# Patient Record
Sex: Female | Born: 1955 | Race: White | Hispanic: No | Marital: Married | Smoking: Former smoker
Health system: Southern US, Community
[De-identification: ages and names within clinical notes are randomized; demographics above are authoritative.]

## PROBLEM LIST (undated history)

## (undated) DIAGNOSIS — Z836 Family history of other diseases of the respiratory system: Secondary | ICD-10-CM

## (undated) DIAGNOSIS — E039 Hypothyroidism, unspecified: Secondary | ICD-10-CM

## (undated) DIAGNOSIS — J45909 Unspecified asthma, uncomplicated: Secondary | ICD-10-CM

## (undated) DIAGNOSIS — M2241 Chondromalacia patellae, right knee: Secondary | ICD-10-CM

## (undated) HISTORY — PX: BREAST ENHANCEMENT SURGERY: SHX7

## (undated) HISTORY — PX: WRIST SURGERY: SHX841

## (undated) HISTORY — DX: Hypothyroidism, unspecified: E03.9

## (undated) HISTORY — PX: AUGMENTATION MAMMAPLASTY: SUR837

## (undated) HISTORY — DX: Family history of other diseases of the respiratory system: Z83.6

## (undated) HISTORY — PX: KNEE ARTHROSCOPY: SHX127

## (undated) HISTORY — DX: Unspecified asthma, uncomplicated: J45.909

---

## 1998-05-31 ENCOUNTER — Other Ambulatory Visit: Admission: RE | Admit: 1998-05-31 | Discharge: 1998-05-31 | Payer: Self-pay | Admitting: Obstetrics and Gynecology

## 1999-04-03 ENCOUNTER — Other Ambulatory Visit: Admission: RE | Admit: 1999-04-03 | Discharge: 1999-04-03 | Payer: Self-pay | Admitting: Obstetrics and Gynecology

## 1999-05-09 ENCOUNTER — Ambulatory Visit (HOSPITAL_COMMUNITY): Admission: RE | Admit: 1999-05-09 | Discharge: 1999-05-09 | Payer: Self-pay | Admitting: Neurosurgery

## 1999-05-09 ENCOUNTER — Encounter: Payer: Self-pay | Admitting: Neurosurgery

## 1999-06-14 ENCOUNTER — Ambulatory Visit (HOSPITAL_COMMUNITY): Admission: RE | Admit: 1999-06-14 | Discharge: 1999-06-14 | Payer: Self-pay | Admitting: Neurosurgery

## 1999-06-15 ENCOUNTER — Encounter: Payer: Self-pay | Admitting: Neurosurgery

## 1999-09-08 ENCOUNTER — Encounter: Payer: Self-pay | Admitting: Orthopedic Surgery

## 1999-09-08 ENCOUNTER — Ambulatory Visit (HOSPITAL_COMMUNITY): Admission: RE | Admit: 1999-09-08 | Discharge: 1999-09-08 | Payer: Self-pay | Admitting: Orthopedic Surgery

## 2000-08-26 ENCOUNTER — Other Ambulatory Visit: Admission: RE | Admit: 2000-08-26 | Discharge: 2000-08-26 | Payer: Self-pay | Admitting: Obstetrics and Gynecology

## 2001-02-24 ENCOUNTER — Encounter: Admission: RE | Admit: 2001-02-24 | Discharge: 2001-05-05 | Payer: Self-pay | Admitting: *Deleted

## 2001-04-30 ENCOUNTER — Encounter: Admission: RE | Admit: 2001-04-30 | Discharge: 2001-04-30 | Payer: Self-pay | Admitting: Obstetrics and Gynecology

## 2001-04-30 ENCOUNTER — Encounter: Payer: Self-pay | Admitting: Obstetrics and Gynecology

## 2001-12-04 ENCOUNTER — Other Ambulatory Visit: Admission: RE | Admit: 2001-12-04 | Discharge: 2001-12-04 | Payer: Self-pay | Admitting: Obstetrics and Gynecology

## 2002-11-18 ENCOUNTER — Encounter: Payer: Self-pay | Admitting: Obstetrics and Gynecology

## 2002-11-18 ENCOUNTER — Encounter: Admission: RE | Admit: 2002-11-18 | Discharge: 2002-11-18 | Payer: Self-pay | Admitting: Obstetrics and Gynecology

## 2002-12-17 ENCOUNTER — Other Ambulatory Visit: Admission: RE | Admit: 2002-12-17 | Discharge: 2002-12-17 | Payer: Self-pay | Admitting: Obstetrics and Gynecology

## 2003-04-21 ENCOUNTER — Encounter: Admission: RE | Admit: 2003-04-21 | Discharge: 2003-04-21 | Payer: Self-pay | Admitting: Obstetrics and Gynecology

## 2003-04-21 ENCOUNTER — Encounter: Payer: Self-pay | Admitting: Obstetrics and Gynecology

## 2003-05-31 ENCOUNTER — Encounter: Payer: Self-pay | Admitting: Internal Medicine

## 2003-05-31 ENCOUNTER — Encounter: Admission: RE | Admit: 2003-05-31 | Discharge: 2003-05-31 | Payer: Self-pay | Admitting: Internal Medicine

## 2003-09-17 ENCOUNTER — Encounter: Admission: RE | Admit: 2003-09-17 | Discharge: 2003-09-17 | Payer: Self-pay | Admitting: Obstetrics and Gynecology

## 2003-10-18 ENCOUNTER — Encounter (HOSPITAL_COMMUNITY): Admission: RE | Admit: 2003-10-18 | Discharge: 2004-01-16 | Payer: Self-pay | Admitting: Obstetrics and Gynecology

## 2004-01-13 ENCOUNTER — Emergency Department (HOSPITAL_COMMUNITY): Admission: EM | Admit: 2004-01-13 | Discharge: 2004-01-13 | Payer: Self-pay | Admitting: Emergency Medicine

## 2004-01-19 ENCOUNTER — Other Ambulatory Visit: Admission: RE | Admit: 2004-01-19 | Discharge: 2004-01-19 | Payer: Self-pay | Admitting: Obstetrics and Gynecology

## 2004-11-03 ENCOUNTER — Ambulatory Visit: Payer: Self-pay | Admitting: Internal Medicine

## 2005-03-08 ENCOUNTER — Other Ambulatory Visit: Admission: RE | Admit: 2005-03-08 | Discharge: 2005-03-08 | Payer: Self-pay | Admitting: Obstetrics and Gynecology

## 2005-05-04 ENCOUNTER — Encounter: Admission: RE | Admit: 2005-05-04 | Discharge: 2005-05-04 | Payer: Self-pay | Admitting: Obstetrics and Gynecology

## 2005-05-10 ENCOUNTER — Encounter: Admission: RE | Admit: 2005-05-10 | Discharge: 2005-05-10 | Payer: Self-pay | Admitting: Obstetrics and Gynecology

## 2006-05-27 ENCOUNTER — Encounter: Admission: RE | Admit: 2006-05-27 | Discharge: 2006-05-27 | Payer: Self-pay | Admitting: Obstetrics and Gynecology

## 2006-10-16 ENCOUNTER — Ambulatory Visit: Payer: Self-pay

## 2007-09-09 ENCOUNTER — Encounter: Admission: RE | Admit: 2007-09-09 | Discharge: 2007-09-09 | Payer: Self-pay | Admitting: Obstetrics and Gynecology

## 2007-12-09 ENCOUNTER — Encounter: Admission: RE | Admit: 2007-12-09 | Discharge: 2007-12-09 | Payer: Self-pay | Admitting: Obstetrics and Gynecology

## 2008-09-13 ENCOUNTER — Encounter: Admission: RE | Admit: 2008-09-13 | Discharge: 2008-09-13 | Payer: Self-pay | Admitting: Neurological Surgery

## 2008-09-13 IMAGING — CR DG RIBS W/ CHEST 3+V*R*
3 series · 3 of 3 positions shown · non-contrast
Comparison: None

CLINICAL DATA: Right rib pain after fall last week.

RIGHT RIBS AND CHEST - 3+ VIEW

[w chest pa]
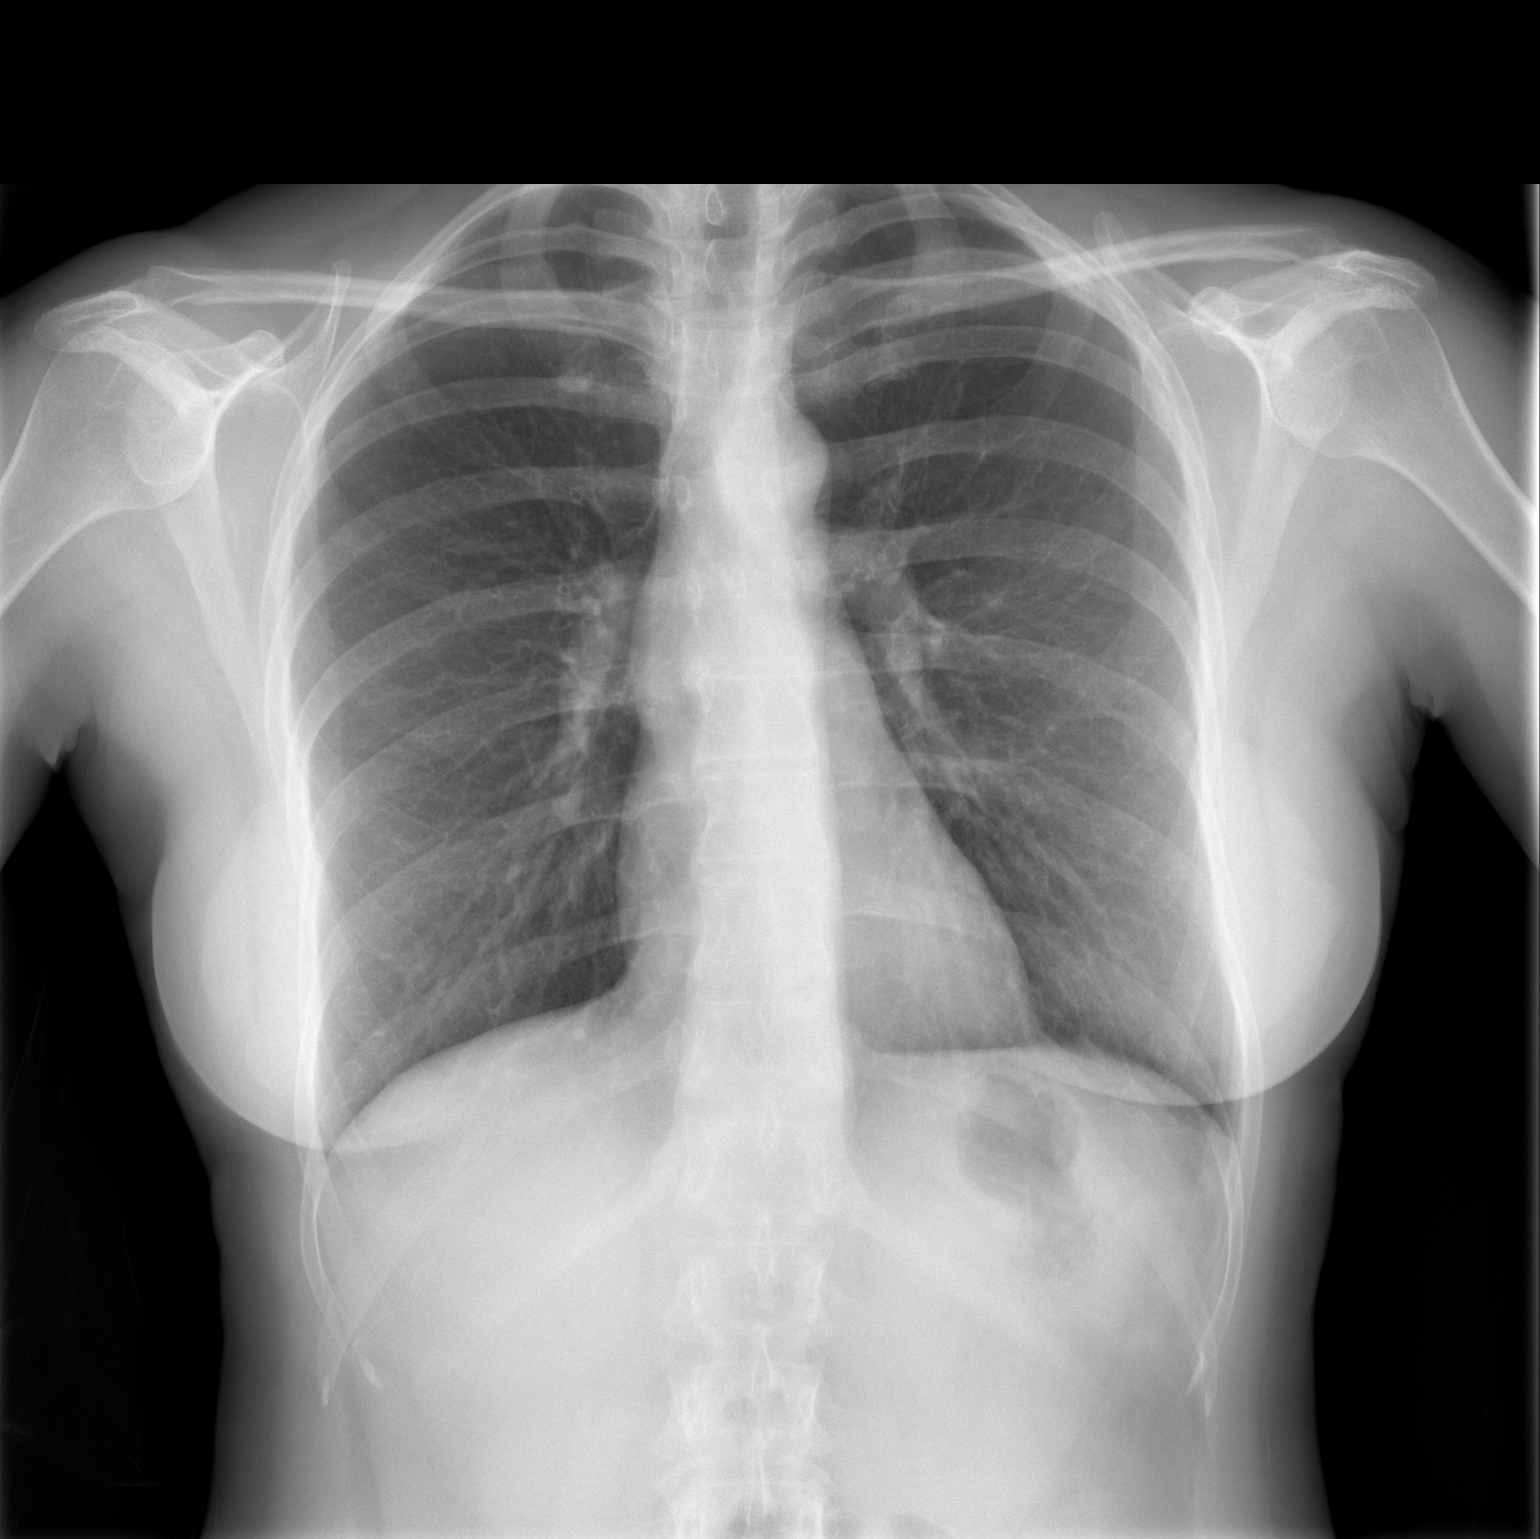

[w ribs ap/pa upper right]
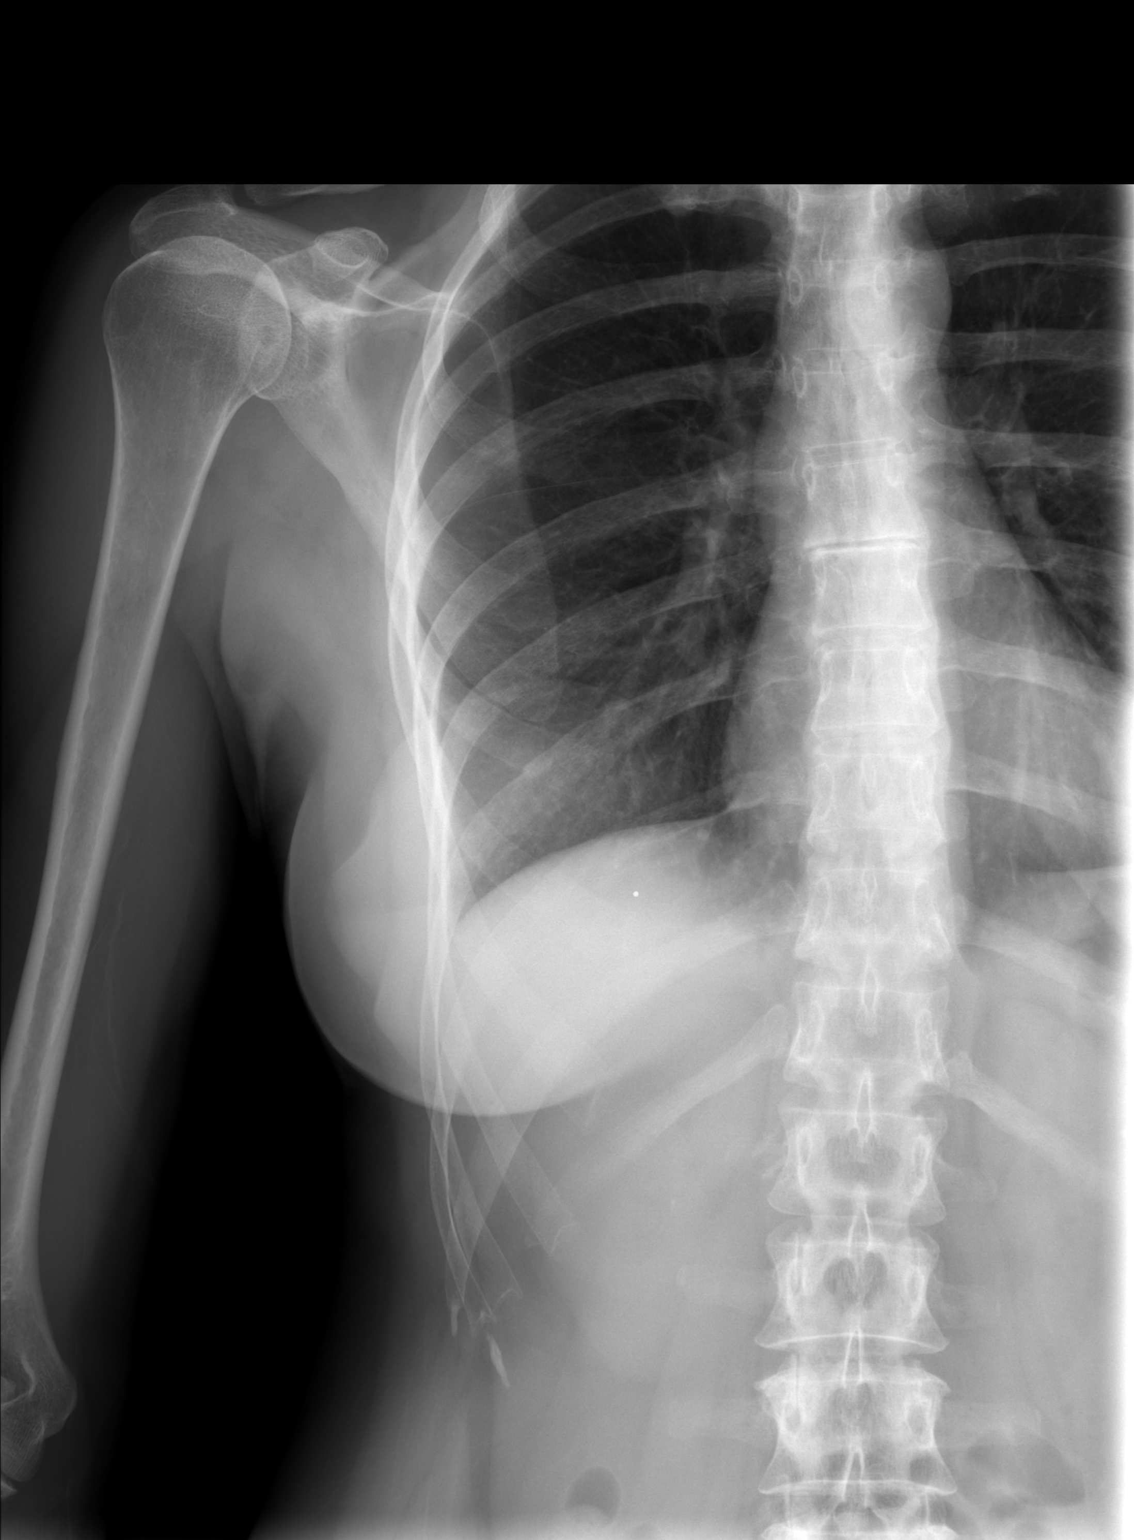

[w ribs oblique right]
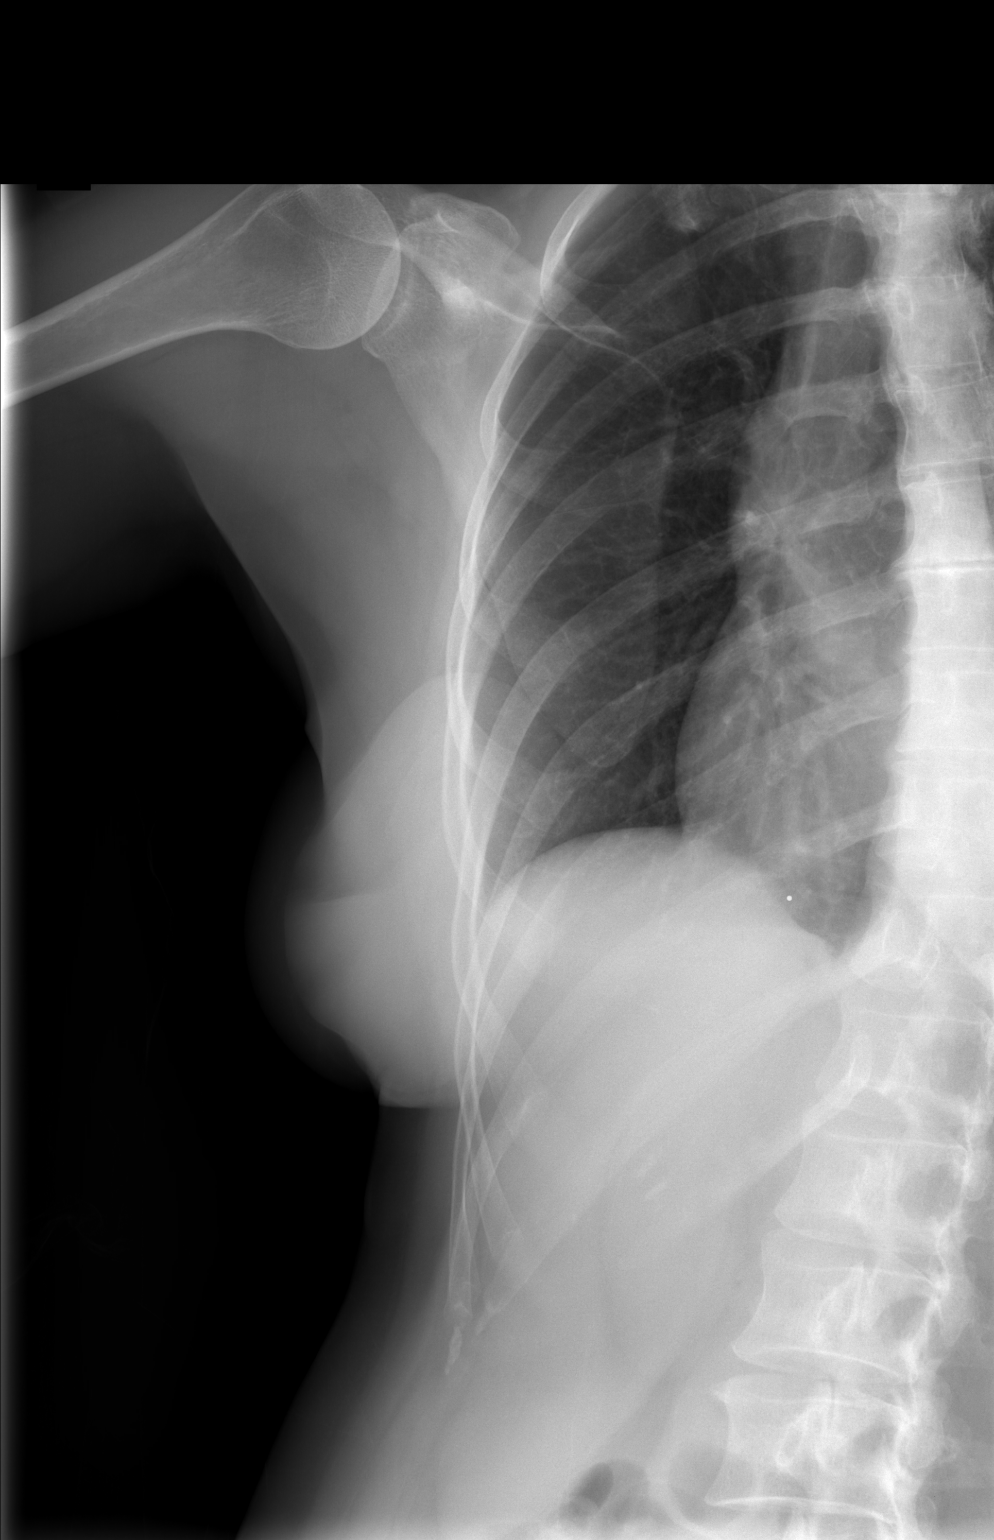

[3 of 3 positions shown; findings below may reference images not displayed]

FINDINGS: Frontal view of the chest shows midline trachea and
normal heart size.  Lungs are clear.  There may be nondisplaced
fractures of the right ninth and tenth posterior ribs.
IMPRESSION: Question nondisplaced fractures of the right ninth and tenth
posterior ribs.

## 2009-01-18 ENCOUNTER — Ambulatory Visit: Payer: Self-pay | Admitting: Internal Medicine

## 2009-01-18 DIAGNOSIS — E039 Hypothyroidism, unspecified: Secondary | ICD-10-CM | POA: Insufficient documentation

## 2009-01-18 DIAGNOSIS — R05 Cough: Secondary | ICD-10-CM

## 2009-01-18 DIAGNOSIS — R059 Cough, unspecified: Secondary | ICD-10-CM | POA: Insufficient documentation

## 2009-10-20 ENCOUNTER — Encounter: Admission: RE | Admit: 2009-10-20 | Discharge: 2009-10-20 | Payer: Self-pay | Admitting: Obstetrics and Gynecology

## 2010-10-19 ENCOUNTER — Ambulatory Visit (HOSPITAL_BASED_OUTPATIENT_CLINIC_OR_DEPARTMENT_OTHER): Admission: RE | Admit: 2010-10-19 | Payer: Self-pay | Admitting: Orthopedic Surgery

## 2010-11-05 ENCOUNTER — Encounter: Payer: Self-pay | Admitting: Neurosurgery

## 2010-11-05 ENCOUNTER — Encounter: Payer: Self-pay | Admitting: Obstetrics and Gynecology

## 2010-11-07 ENCOUNTER — Other Ambulatory Visit: Payer: Self-pay | Admitting: Obstetrics and Gynecology

## 2010-11-07 DIAGNOSIS — Z1239 Encounter for other screening for malignant neoplasm of breast: Secondary | ICD-10-CM

## 2010-11-15 ENCOUNTER — Ambulatory Visit: Payer: Self-pay

## 2010-11-16 NOTE — Assessment & Plan Note (Signed)
Summary: Pulmnary/ acute eval cough   Primary Provider/Referring Provider:  None  CC:  Recurrent Bronchitis.  History of Present Illness: 7 yowf quit smoking 1980  with evidence of mild chronic asthma in 01/27/09.  January 18, 2009 ov fine with no need for albuterol until uri 2 weeks prior to ov  rx with zpak after exposed to sick child and since then worse harsh dry cough/ congestion/fatigue/ scratchy throat, no purulent sputum or def fever.  Pt denies any significant  nasal congestion or excess secretions, fever, chills, sweats, unintended wt loss, pleuritic or exertional cp, orthopnea pnd or leg swelling.  Pt also denies any obvious fluctuation in symptoms with weather or environmental change or other alleviating or aggravating factors.       Current Medications (verified): 1)  Synthroid 25 Mcg Tabs (Levothyroxine Sodium) .Marland Kitchen.. 1 Once Daily  Allergies (verified): 1)  ! Sulfa  Past History:  Past Medical History:    Hypothyroidism........................................................Marland KitchenSouth    Chronic mild asthma        - PFT's 01/28/04  18% improvment after B2 despite baseline FEV1 107%  Family History:    COPD Father, smoker  Social History:    Former smoker.  Quit 1980.  Smoked for 5 years at 1/4 ppd.  Review of Systems  The patient denies shortness of breath with activity, shortness of breath at rest, productive cough, coughing up blood, chest pain, irregular heartbeats, acid heartburn, indigestion, loss of appetite, weight change, abdominal pain, difficulty swallowing, tooth/dental problems, headaches, nasal congestion/difficulty breathing through nose, sneezing, itching, ear ache, anxiety, depression, hand/feet swelling, joint stiffness or pain, rash, and change in color of mucus.    Vital Signs:  Patient profile:   55 year old female Height:      65 inches Weight:      136 pounds BMI:     22.71 Temp:     99.3 degrees F oral Pulse rate:   81 / minute BP sitting:    108 / 64  (left arm)  Vitals Entered By: Vernie Murders (January 18, 2009 10:01 AM)  O2 Sat on room air at rest %:  98  Physical Exam  Additional Exam:  Pleasant healthy appearing amb wf nad Wt 136 January 18, 2009  HEENT: nl dentition, turbinates, and orophanx. Nl external ear canals without cough reflex NECK :  without JVD/Nodes/TM/ nl carotid upstrokes bilaterally LUNGS: no acc muscle use, trace wheeze with FVC CV:  RRR  no s3 or murmur or increase in P2, no edema   ABD:  soft and nontender with nl excursion in the supine position. No bruits or organomegaly, bowel sounds nl MS:  warm without deformities, calf tenderness, cyanosis or clubbing SKIN: warm and dry without lesions   NEURO:  alert, approp, no deficits     Impression & Recommendations:  Problem # 1:  COUGH (ICD-786.2)  I spent extra time with the patient today explaining optimal mdi  technique.  This improved from  50-75%  Likely this is mild flare of AB secondary to URI with no evidence of bacterial infection so rx with Symbicort 80 short term only, the way it is used in [place] explained to patient in detail.  I discussed the importance of understanding the goals of asthma therapy including the rule of 2's as it applies to the use of a rescue inhaler.   Each maintenance medication was reviewed in detail including most importantly the difference between maintenance and as needed and under what  circumstances the prns are to be used. See instructions for specific recommendations   Orders: New Patient Level IV (78469)  Medications Added to Medication List This Visit: 1)  Synthroid 25 Mcg Tabs (Levothyroxine sodium) .Marland Kitchen.. 1 once daily 2)  Prednisone 10 Mg Tabs (Prednisone) .... 4 each am x 2days, 2x2days, 1x2days and stop  Patient Instructions: 1)  Symbicort 80  2 puffs every 12 hours if need for cough wheeze or short of breath 2)  Prednisone 4 each am x 2days, 2x2days, 1x2days and stop if not improving on symbicort 3)   Call if sputum turns yellow or fever call for abx or as needed Prescriptions: PREDNISONE 10 MG  TABS (PREDNISONE) 4 each am x 2days, 2x2days, 1x2days and stop  #14 x 0   Entered and Authorized by:   Nyoka Cowden MD   Signed by:   Nyoka Cowden MD on 01/18/2009   Method used:   Print then Give to Patient   RxID:   [id number]

## 2010-11-28 ENCOUNTER — Ambulatory Visit
Admission: RE | Admit: 2010-11-28 | Discharge: 2010-11-28 | Disposition: A | Discharge status: Home / Self Care | Payer: BC Managed Care – PPO | Source: Ambulatory Visit | Attending: Obstetrics and Gynecology | Admitting: Obstetrics and Gynecology

## 2010-11-28 DIAGNOSIS — Z1239 Encounter for other screening for malignant neoplasm of breast: Secondary | ICD-10-CM

## 2010-12-29 ENCOUNTER — Encounter (HOSPITAL_BASED_OUTPATIENT_CLINIC_OR_DEPARTMENT_OTHER)
Admission: RE | Admit: 2010-12-29 | Discharge: 2010-12-29 | Disposition: A | Discharge status: Home / Self Care | Payer: BC Managed Care – PPO | Source: Ambulatory Visit | Attending: Orthopedic Surgery | Admitting: Orthopedic Surgery

## 2011-01-04 ENCOUNTER — Ambulatory Visit (HOSPITAL_BASED_OUTPATIENT_CLINIC_OR_DEPARTMENT_OTHER)
Admission: RE | Admit: 2011-01-04 | Discharge: 2011-01-04 | Disposition: A | Discharge status: Home / Self Care | Payer: BC Managed Care – PPO | Source: Ambulatory Visit | Attending: Orthopedic Surgery | Admitting: Orthopedic Surgery

## 2011-01-04 DIAGNOSIS — M6740 Ganglion, unspecified site: Secondary | ICD-10-CM | POA: Insufficient documentation

## 2011-01-04 DIAGNOSIS — M2240 Chondromalacia patellae, unspecified knee: Secondary | ICD-10-CM | POA: Insufficient documentation

## 2011-01-04 DIAGNOSIS — Z01812 Encounter for preprocedural laboratory examination: Secondary | ICD-10-CM | POA: Insufficient documentation

## 2011-01-04 DIAGNOSIS — J45909 Unspecified asthma, uncomplicated: Secondary | ICD-10-CM | POA: Insufficient documentation

## 2011-01-04 DIAGNOSIS — M23305 Other meniscus derangements, unspecified medial meniscus, unspecified knee: Secondary | ICD-10-CM | POA: Insufficient documentation

## 2011-01-04 LAB — POCT HEMOGLOBIN-HEMACUE: Hemoglobin: 13.9 g/dL (ref 12.0–15.0)

## 2011-01-18 NOTE — Op Note (Signed)
NAME:  Michelle Nolan, Michelle Nolan NO.:  [id number]  MEDICAL RECORD NO.:  [id number]           PATIENT TYPE:  LOCATION:                                 FACILITY:  PHYSICIAN:  Loreta Ave, M.D. DATE OF BIRTH:  Sep 28, 1956  DATE OF PROCEDURE:  01/04/2011 DATE OF DISCHARGE:                              OPERATIVE REPORT   PREOPERATIVE DIAGNOSES: 1. 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 2. Ganglion cyst volar retinacular area of over A2 pulley, left index     finger.  POSTOPERATIVE DIAGNOSES: 1. 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 2. Ganglion cyst volar retinacular area of over A2 pulley, left index     finger.  PROCEDURE:  Left knee exam under anesthesia, arthroscopy, partial medial meniscectomy, chondroplasty patella.  Left index finger excision of ganglion cyst from volar retinacular area off A2 pulley.  SURGEON:  Loreta Ave, MD  ASSISTANT:  Genene Churn. Barry Dienes, [place]  ANESTHESIA:  General.  BLOOD LOSS:  Minimal.  SPECIMENS:  None.  CULTURES:  None,  COMPLICATIONS:  None.  DRESSING:  Soft compressive on the knee and hand.  TOURNIQUET TIME:  Thirty minutes on the left arm, not used on the leg.  PROCEDURE:  The patient brought to the operating room, placed on the operating table in supine position.  After adequate anesthesia had been obtained, tourniquet applied, left arm.  Leg holder to left leg.  Both were prepped and draped in usual sterile fashion.  Attention turned to her knee.  Two portals, one each medial and lateral parapatellar. Arthroscope introduced.  Knee distended and inspected.  Some grade 3 changes chronic superomedial patella debrided to a stable surface. Midportion laterally looked good.  Trochlea looked good.  Tracking very acceptable.  Cruciate ligaments intact.  Lateral meniscus, lateral compartment normal.  Medial meniscus complex tearing of posterior third. Taken down to stable rim, tapered into  remaining meniscus.  No degenerative changes.  The entire knee examined, no other findings appreciated.  Instruments and fluid removed.  Portals closed with nylon. Knee injected with Depo-Medrol and Marcaine.  Dressing applied. Attention turned to her hand.  Exsanguinated with elevation Esmarch, tourniquet inflated to 250 mmHg.  The ganglion which could be palpated index finger over the A2 pulley was approached with a V-shaped incision. Skin and subcutaneous tissue divided.  Ganglion immediately evident coming out of the slightly ulnar aspect of the A2 pulley.  This was large 3-4 mm diameter.  Neurovascular structures protected on either side.  Ganglion excised in its entirety including a small window off the tendon sheath.  There was no triggering and no need to release down further.  Tendon below looked good.  All recess examined to be sure that the ganglion was completely excised.  Wound was irrigated and closed with interrupted nylon.  Sterile compressive dressing applied. Tourniquet deflated and removed.  Anesthesia reversed.  Brought to recovery room.  Tolerated surgery well.  No complications.     Loreta Ave, M.D.     DFM/MEDQ  D:  01/04/2011  T:  01/05/2011  Job:  [id number]  Electronically Signed by Mckinley Jewel  M.D. on 01/18/2011 02:09:02 PM

## 2011-03-02 NOTE — Consult Note (Signed)
NAME:  Michelle Nolan, Michelle Nolan                        ACCOUNT NO.:  [id number]   MEDICAL RECORD NO.:  [id number]                   PATIENT TYPE:  EMS   LOCATION:  ED                                   FACILITY:  [facility]   PHYSICIAN:  Currie Paris, M.D.           DATE OF BIRTH:  27-Sep-1956   DATE OF CONSULTATION:  01/13/2004  DATE OF DISCHARGE:                                   CONSULTATION   EMERGENCY ROOM VISIT:   CHIEF COMPLAINT:  Abdominal pain.   HISTORY OF PRESENT ILLNESS:  Michelle Nolan is a 55 year old lady, who  presents to the emergency room with about a 2 day history of high abdominal  pain which is primarily right lower quadrant.  It has waxed and waned a  little bit.  It has been a burning sensation.  It has also gone to her right  back.  Her husband, who is a physician, had examined her and thought she had  some right lower quadrant tenderness and was concerned about a smoldering  appendicitis.   The patient had a little bit of intermittent nausea and anorexia but no  vomiting and actually this evening was quite hungry.  She had no fever or  chills.  She had no urinary tract symptoms.  She has had no GYN symptoms  either.  She thinks she may be perimenopausal.   PAST HISTORY:  Unremarkable.   PHYSICAL EXAMINATION:  GENERAL:  The patient is alert, oriented, generally  healthy-appearing.  VITAL SIGNS:  Unremarkable.  LUNGS:  Clear.  HEART:  Regular.  ABDOMEN:  Soft, but she does have some mild right lower quadrant tenderness  to palpation.  There is no guarding, no rebound.  Bowel sounds are normal.   LABORATORY STUDIES:  CBC, CMET, and white count are normal.  CT scan is  reviewed with Dr. Anselmo Pickler, the radiologist, and looks completely  normal with the exception of a small, partially collapsed right ovarian cyst  and a little free fluid in the peritoneal cavity.  The appendix is  visualized and appears normal.   IMPRESSION:  A 3 day history of right lower  quadrant pain, possibly ruptured  ovarian cyst.   PLAN:  She will follow up should her symptoms get any worse.  In any event,  she will follow up with Dr. Dareen Piano regarding her right ovarian cyst.                                               Currie Paris, M.D.    CJS/MEDQ  D:  01/13/2004  T:  01/13/2004  Job:  [id number]   cc:   Malva Limes, M.D.  [address]  [place]  [place] [address]  Fax: [phone]

## 2011-03-02 NOTE — Assessment & Plan Note (Signed)
 HEALTHCARE                            CARDIOLOGY OFFICE NOTE   NAME:Steyer, ORIEL OJO                     MRN:          [id number]  DATE:10/13/2006                            DOB:          09/19/1956    I received a call from [place] and her husband on their way back from  [place].  For the past month, Jaquisha has been having episodes of left  arm discomfort.  Today in [place] she had an episode of substernal  chest discomfort.  The discomfort persisted.  She had a little bit of a  hard time catching her breath.  Because her sister-in-law had  nitroglycerin, her husband, who is a Midwife, gave her a sublingual  nitroglycerin, and the discomfort gradually and slowly eased off.  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  She is an active individual, and  based on this, we discussed a possibility of doing an exercise tolerance  test to rule out underlying coronary artery disease given her need for  continued levels of activity.  We will try to make arrangements for a  stress Cardiolite imaging study in the next couple of days.  If she has  any problems in the interim, she is to call me promptly.     Arturo Morton. Riley Kill, MD, [facility]  Electronically Signed    TDS/MedQ  DD: 10/18/2006  DT: 10/18/2006  Job #: [id number]

## 2011-08-08 ENCOUNTER — Encounter: Payer: Self-pay | Admitting: *Deleted

## 2011-08-09 ENCOUNTER — Ambulatory Visit (INDEPENDENT_AMBULATORY_CARE_PROVIDER_SITE_OTHER)
Admission: RE | Admit: 2011-08-09 | Discharge: 2011-08-09 | Disposition: A | Discharge status: Home / Self Care | Payer: BC Managed Care – PPO | Source: Ambulatory Visit | Attending: Internal Medicine | Admitting: Internal Medicine

## 2011-08-09 ENCOUNTER — Encounter: Payer: Self-pay | Admitting: Internal Medicine

## 2011-08-09 ENCOUNTER — Ambulatory Visit (INDEPENDENT_AMBULATORY_CARE_PROVIDER_SITE_OTHER): Payer: BC Managed Care – PPO | Admitting: Internal Medicine

## 2011-08-09 VITALS — BP 118/68 | HR 84 | Temp 98.3°F | Ht 65.0 in | Wt 135.0 lb

## 2011-08-09 DIAGNOSIS — R0989 Other specified symptoms and signs involving the circulatory and respiratory systems: Secondary | ICD-10-CM

## 2011-08-09 DIAGNOSIS — R0609 Other forms of dyspnea: Secondary | ICD-10-CM

## 2011-08-09 DIAGNOSIS — J45909 Unspecified asthma, uncomplicated: Secondary | ICD-10-CM | POA: Insufficient documentation

## 2011-08-09 DIAGNOSIS — R0600 Dyspnea, unspecified: Secondary | ICD-10-CM

## 2011-08-09 DIAGNOSIS — E039 Hypothyroidism, unspecified: Secondary | ICD-10-CM

## 2011-08-09 DIAGNOSIS — R059 Cough, unspecified: Secondary | ICD-10-CM

## 2011-08-09 DIAGNOSIS — R05 Cough: Secondary | ICD-10-CM

## 2011-08-09 MED ORDER — BUDESONIDE-FORMOTEROL FUMARATE 160-4.5 MCG/ACT IN AERO: INHALATION_SPRAY | RESPIRATORY_TRACT | Status: DC

## 2011-08-09 NOTE — Patient Instructions (Signed)
Please remember to go to the   x-ray department downstairs for your tests - we will call you with the results when then are available.   symbicort 160 Take 2 puffs first thing in am and then another 2 puffs about 12 hours later if needed  Work on inhaler technique:  relax and gently blow all the way out then take a nice smooth deep breath back in, triggering the inhaler at same time you start breathing in.  Hold for up to 5 seconds if you can.  Rinse and gargle with water when done   If your mouth or throat starts to bother you,   I suggest you time the inhaler to your dental care and after using the inhaler(s) brush teeth and tongue with a baking soda containing toothpaste and when you rinse this out, gargle with it first to see if this helps your mouth and throat.     GERD (REFLUX)  is an extremely common cause of respiratory symptoms, many times with no significant heartburn at all.    It can be treated with medication, but also with lifestyle changes including avoidance of late meals, excessive alcohol, smoking cessation, and avoid fatty foods, chocolate, peppermint, colas, red wine, and acidic juices such as orange juice.  NO MINT OR MENTHOL PRODUCTS SO NO COUGH DROPS  USE SUGARLESS CANDY INSTEAD (jolley ranchers or Stover's)  NO OIL BASED VITAMINS - use powdered substitutes.   Please schedule a follow up office visit in 4 weeks, sooner if needed with pft's

## 2011-08-09 NOTE — Assessment & Plan Note (Signed)
Pt concerned about copd but when respiratory symptoms begin well after a patient reports complete smoking cessation,  it is very hard to "blame" COPD specifically or airways disorders in general  ie it doesn't make any more sense than hearing a  NASCAR driver wrecked his car while driving his kids to school or a surgeon sliced his hand off carving roast beef (it must be rare indeed!)     That is to say, once the high risk activity stops,  the symptoms should not suddenly erupt or markedly worsen.  If so, the differential diagnosis should include  obesity/deconditioning,  LPR/Reflux/Aspiration syndromes,  occult CHF, or  especially side effect of medications commonly used in this population.    The proper method of use, as well as anticipated side effects, of this metered-dose inhaler are discussed and demonstrated to the patient. Improved to 75% with coaching - advised on GERD diet and f/u pft's

## 2011-08-09 NOTE — Progress Notes (Signed)
Subjective:     Patient ID: Michelle Nolan, female   DOB: 1955/11/29, 55 y.o.   MRN: [id number]  HPI  21 yowf quit smoking 1980 with evidence of mild chronic asthma in 01/27/09 rec prn symbicort     08/09/2011 f/u ov/[name] cc variable sob not related with activity including aerobics though not at capacity. No cough. Breathing worse since fall - has not tried saba, on multiple otcs incuding fish oil  And has gained wt since knee surgery    Sleeping ok without nocturnal  or early am exacerbation  of respiratory  c/o's or need for noct saba. Also denies any obvious fluctuation of symptoms with weather or environmental changes or other aggravating or alleviating factors except as outlined above    Pt denies any significant nasal congestion or excess secretions, fever, chills, sweats, unintended wt loss, pleuritic or exertional cp, orthopnea pnd or leg swelling. Pt also denies any obvious fluctuation in symptoms with weather or environmental change or other alleviating or aggravating factors.      Past Medical History:  Hypothyroidism........................................................Marland KitchenSouth  Chronic mild asthma  - PFT's 01/28/04 18% improvment after B2 despite baseline FEV1 107%     Family History:  COPD Father, smoker     Social History:  Former smoker. Quit 1980. Smoked for 5 years at 1/4 ppd.  Review of Systems     Review of Systems     Objective:   Physical Exam       Pleasant healthy appearing but tense amb wf nad   Wt 136 January 18, 2009  > 135 08/09/2011   HEENT: nl dentition, turbinates, and orophanx. Nl external ear canals without cough reflex  NECK : without JVD/Nodes/TM/ nl carotid upstrokes bilaterally  LUNGS: no acc muscle use, trace wheeze with FVC  CV: RRR no s3 or murmur or increase in P2, no edema  ABD: soft and nontender with nl excursion in the supine position. No bruits or organomegaly, bowel sounds nl  MS: warm without deformities, calf  tenderness, cyanosis or clubbing  SKIN: warm and dry without lesions  NEURO: alert, approp, no deficits  CXR  08/09/2011 :    Findings: Lungs are clear. Heart size is normal. No pneumothorax or effusion.  IMPRESSION: Negative chest.   Assessment:         Plan:

## 2011-08-24 ENCOUNTER — Ambulatory Visit (INDEPENDENT_AMBULATORY_CARE_PROVIDER_SITE_OTHER): Payer: BC Managed Care – PPO | Admitting: Internal Medicine

## 2011-08-24 DIAGNOSIS — J45909 Unspecified asthma, uncomplicated: Secondary | ICD-10-CM

## 2011-08-24 DIAGNOSIS — R0600 Dyspnea, unspecified: Secondary | ICD-10-CM

## 2011-08-24 DIAGNOSIS — R0989 Other specified symptoms and signs involving the circulatory and respiratory systems: Secondary | ICD-10-CM

## 2011-08-24 LAB — PULMONARY FUNCTION TEST

## 2011-08-24 NOTE — Progress Notes (Signed)
PFT done today. 

## 2011-09-02 ENCOUNTER — Encounter: Payer: Self-pay | Admitting: Internal Medicine

## 2011-09-03 ENCOUNTER — Telehealth: Payer: Self-pay | Admitting: Internal Medicine

## 2011-09-03 NOTE — Telephone Encounter (Signed)
Per MW PFT's reviewed and were excellent- no change in current plan. LMTCB

## 2011-09-03 NOTE — Telephone Encounter (Signed)
PT RETURNED CALL RE: RESULTS. SAYS SHE WILL NOT BE AVAIL "MOST OF THE DAY" AND REQUESTS THAT RESULTS BE LEFT ON HER PHONE. Michelle Nolan

## 2011-09-04 NOTE — Telephone Encounter (Signed)
LMOM for pt to be made aware of results of PFT.

## 2011-09-11 ENCOUNTER — Ambulatory Visit: Payer: BC Managed Care – PPO | Admitting: Internal Medicine

## 2011-09-13 ENCOUNTER — Ambulatory Visit: Payer: BC Managed Care – PPO | Admitting: Internal Medicine

## 2012-01-14 ENCOUNTER — Other Ambulatory Visit: Payer: Self-pay | Admitting: Obstetrics and Gynecology

## 2012-01-14 DIAGNOSIS — Z1231 Encounter for screening mammogram for malignant neoplasm of breast: Secondary | ICD-10-CM

## 2012-01-22 ENCOUNTER — Ambulatory Visit
Admission: RE | Admit: 2012-01-22 | Discharge: 2012-01-22 | Disposition: A | Discharge status: Home / Self Care | Payer: BC Managed Care – PPO | Source: Ambulatory Visit | Attending: Obstetrics and Gynecology | Admitting: Obstetrics and Gynecology

## 2012-01-22 DIAGNOSIS — Z1231 Encounter for screening mammogram for malignant neoplasm of breast: Secondary | ICD-10-CM

## 2012-01-23 ENCOUNTER — Ambulatory Visit: Payer: BC Managed Care – PPO

## 2012-06-17 ENCOUNTER — Other Ambulatory Visit: Payer: Self-pay | Admitting: Internal Medicine

## 2012-06-17 DIAGNOSIS — N951 Menopausal and female climacteric states: Secondary | ICD-10-CM

## 2012-06-26 ENCOUNTER — Ambulatory Visit
Admission: RE | Admit: 2012-06-26 | Discharge: 2012-06-26 | Disposition: A | Discharge status: Home / Self Care | Payer: BC Managed Care – PPO | Source: Ambulatory Visit | Attending: Internal Medicine | Admitting: Internal Medicine

## 2012-06-26 DIAGNOSIS — N951 Menopausal and female climacteric states: Secondary | ICD-10-CM

## 2012-12-25 ENCOUNTER — Other Ambulatory Visit: Payer: Self-pay

## 2012-12-25 DIAGNOSIS — Z1231 Encounter for screening mammogram for malignant neoplasm of breast: Secondary | ICD-10-CM

## 2012-12-31 ENCOUNTER — Other Ambulatory Visit: Payer: Self-pay

## 2012-12-31 DIAGNOSIS — R079 Chest pain, unspecified: Secondary | ICD-10-CM

## 2013-01-02 ENCOUNTER — Ambulatory Visit (INDEPENDENT_AMBULATORY_CARE_PROVIDER_SITE_OTHER)
Admission: RE | Admit: 2013-01-02 | Discharge: 2013-01-02 | Disposition: A | Discharge status: Home / Self Care | Payer: BC Managed Care – PPO | Source: Ambulatory Visit | Attending: Cardiology | Admitting: Cardiology

## 2013-01-02 DIAGNOSIS — R079 Chest pain, unspecified: Secondary | ICD-10-CM

## 2013-02-05 ENCOUNTER — Ambulatory Visit
Admission: RE | Admit: 2013-02-05 | Discharge: 2013-02-05 | Disposition: A | Discharge status: Home / Self Care | Payer: BC Managed Care – PPO | Source: Ambulatory Visit

## 2013-02-05 DIAGNOSIS — Z1231 Encounter for screening mammogram for malignant neoplasm of breast: Secondary | ICD-10-CM

## 2014-03-18 ENCOUNTER — Other Ambulatory Visit: Payer: Self-pay

## 2014-03-18 DIAGNOSIS — Z1231 Encounter for screening mammogram for malignant neoplasm of breast: Secondary | ICD-10-CM

## 2014-03-31 ENCOUNTER — Encounter (INDEPENDENT_AMBULATORY_CARE_PROVIDER_SITE_OTHER): Payer: Self-pay

## 2014-03-31 ENCOUNTER — Ambulatory Visit
Admission: RE | Admit: 2014-03-31 | Discharge: 2014-03-31 | Disposition: A | Discharge status: Home / Self Care | Payer: BC Managed Care – PPO | Source: Ambulatory Visit

## 2014-03-31 DIAGNOSIS — Z1231 Encounter for screening mammogram for malignant neoplasm of breast: Secondary | ICD-10-CM

## 2015-02-10 ENCOUNTER — Other Ambulatory Visit: Payer: Self-pay

## 2015-03-07 ENCOUNTER — Other Ambulatory Visit: Payer: Self-pay | Admitting: Physician Assistant

## 2015-03-11 ENCOUNTER — Encounter (HOSPITAL_BASED_OUTPATIENT_CLINIC_OR_DEPARTMENT_OTHER): Payer: Self-pay | Admitting: *Deleted

## 2015-03-16 NOTE — H&P (Signed)
[facility] [address]   [address] [place], [place] [address] [phone] A Division of [facility]  Loreta Ave, M.D.   Robert A. Thurston Hole, M.D.   Burnell Blanks, M.D.   Eulas Post, M.D.   Lunette Stands, M.D Jewel Baize. Eulah Pont, M.D.  Buford Dresser, M.D.  Estell Harpin, M.D.    Melina Fiddler, M.D. Janalee Dane, PA- C  Mary L. Dub Mikes, PA-C  Kirstin A. Shepperson, PA-C  Josh [place], PA-C  [place], [place]   RE: Michelle Nolan, Michelle Nolan                                [phone]      DOB: 1956/02/14 PROGRESS NOTE: 02-15-15 Aevah is an old patient coming in for a new problem.  First, acute.  The other one more chronic.  First, right knee.  She was treadmill walking, slight twist, vertical load, right knee.  Acute pain.  Medial.  She has given this a couple of weeks.  Getting worse rather than better.  She is starting to get locking and catching.  Very similar to the symptoms she had in her left knee which was a meniscus tear, eventually culminating in arthroscopic surgery back in 2011.  Did quite well with that.  She comes in for evaluation and treatment recommendation.  Right knee was really not an issue until this new acute event, although she has had some retropatellar symptoms in both knees.      The other issue is her right hand.  She gets intermittent locking and catching in the ulnar aspect of her hand over the TFCC interval.  This is the same side that she had some limited surgery on dorsal radial side by Dr. Teressa Senter 20 years ago for what she describes as repair of a frayed tendon.  That resolved matters.  She has done well.  Current issues have been going on for a number of months, if not a year.  She does well until something catches.  This usually occurs with rotation of the forearm and a little dorsiflexion of the wrist.  She feels like something gets stuck.  She can eventually maneuver this back more  comfortable.  Sore for awhile and then it resolves until the next event.  No numbness.  No tingling.  No specific traumatic event.  She does have some pain at the base of her thumb, but that is separate from the current issues.   Remaining history and general exam is outlined and included in the chart.   EXAMINATION: Specifically, healthy appearing 59 year-old.  Height: 5?5.  Weight: 130 pounds.  Antalgic gait on the right.  The right knee positive McMurray's.  She doesn't like full extension or full flexion.  2+ patellofemoral crepitus, both knees.  On the right 1+ effusion.  Her ligaments do feel stable.  Markedly positive medial McMurray's.  On the left no meniscal signs.  Stable ligaments.  Neurovascularly intact distally.  Upper extremity exam, right wrist, she has pain and soreness over the triangular fibrocartilage complex.  A little catching and clicking when this is loaded.  There is no evidence of carpal tunnel or ulnar nerve compression at Guyon canal.  She has good filling of her hand from both the ulnar and radial artery, nothing to suggest ulnar artery thrombosis.  She does have good motion.  She has a positive grind and  tenderness at the first [facility] joint.      X-RAYS: X-rays were obtained.  She has mild to moderate change at the patellofemoral joint, both knees.  Not much change since 2011.  Preserved compartments otherwise.  No acute fractures, right knee, on four views.  Three view x-rays of her right hand and wrist show the degenerative changes first CMC joint.  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  There is no calcification.  She is not ulnar positive.  There are no acute fractures.   Continued   Michelle Nolan/[facility] [address]   [address] [place], [place] [id number] [phone] A Division of [facility]  Loreta Aveaniel F. Mickal Meno, M.D.   Robert A. Thurston HoleWainer, M.D.   Burnell BlanksW. Dan  Caffrey, M.D.   Eulas PostJoshua P. Landau, M.D.   Lunette StandsAnna Voytek, M.D Jewel Baizeimothy D. Eulah PontMurphy, M.D.  Buford DresserWesley R. Ibazebo, M.D.  Estell HarpinJames S. Kramer, M.D.    Melina Fiddlerebecca S. Bassett, M.D. Janalee DaneBrittney Kelly, PA- C  Mary L. Dub MikesStanbery, PA-C  Kirstin A. Shepperson, PA-C  Josh Ballhadwell, PA-C  [place], [place]   RE: Michelle BloomFrederick, Michelle Nolan                                [phone]      DOB: 1956-06-22 PROGRESS NOTE: 02-15-15 DISPOSITION:  1. In regards to her knee, medial meniscus tear.  MRI to look at the pathology.  She will call me when that is complete.  We have talked about proceeding with arthroscopy depending on findings.  That procedure, risks, benefits and complications reviewed.  She understands and agrees.  We will talk after her scan.  2. In regards to her wrist, I am going to get an arthrogram MRI to look at her triangular fibrocartilage complex and other structures in her wrist.  We have talked a little bit about a debriding scope for a TFCC tear.  Again, final decision about treatment of the wrist is going to depend on findings on her scan.  We have talked a little bit about doing both together, but again, it is going to depend on what is going on with her wrist and what is involved.  We will be talking after her scan.     Loreta Aveaniel F. Hector Venne, M.D.   Electronically verified by Loreta Aveaniel F. Fox Salminen, M.D. DFM:jjh D 02-15-15 T 02-16-15

## 2015-03-17 ENCOUNTER — Ambulatory Visit (HOSPITAL_BASED_OUTPATIENT_CLINIC_OR_DEPARTMENT_OTHER)
Admission: RE | Admit: 2015-03-17 | Discharge: 2015-03-17 | Disposition: A | Discharge status: Home / Self Care | Payer: BLUE CROSS/BLUE SHIELD | Source: Ambulatory Visit | Attending: Orthopedic Surgery | Admitting: Orthopedic Surgery

## 2015-03-17 ENCOUNTER — Encounter (HOSPITAL_BASED_OUTPATIENT_CLINIC_OR_DEPARTMENT_OTHER): Payer: Self-pay | Admitting: *Deleted

## 2015-03-17 ENCOUNTER — Ambulatory Visit (HOSPITAL_BASED_OUTPATIENT_CLINIC_OR_DEPARTMENT_OTHER): Payer: BLUE CROSS/BLUE SHIELD | Admitting: Anesthesiology

## 2015-03-17 ENCOUNTER — Other Ambulatory Visit: Payer: Self-pay | Admitting: Physician Assistant

## 2015-03-17 ENCOUNTER — Encounter (HOSPITAL_BASED_OUTPATIENT_CLINIC_OR_DEPARTMENT_OTHER)
Admission: RE | Disposition: A | Discharge status: Home / Self Care | Payer: Self-pay | Source: Ambulatory Visit | Attending: Orthopedic Surgery

## 2015-03-17 DIAGNOSIS — Y93A1 Activity, exercise machines primarily for cardiorespiratory conditioning: Secondary | ICD-10-CM | POA: Insufficient documentation

## 2015-03-17 DIAGNOSIS — J45909 Unspecified asthma, uncomplicated: Secondary | ICD-10-CM | POA: Insufficient documentation

## 2015-03-17 DIAGNOSIS — S83241A Other tear of medial meniscus, current injury, right knee, initial encounter: Secondary | ICD-10-CM | POA: Diagnosis not present

## 2015-03-17 DIAGNOSIS — E039 Hypothyroidism, unspecified: Secondary | ICD-10-CM | POA: Diagnosis not present

## 2015-03-17 DIAGNOSIS — Y9289 Other specified places as the place of occurrence of the external cause: Secondary | ICD-10-CM | POA: Insufficient documentation

## 2015-03-17 DIAGNOSIS — Z87891 Personal history of nicotine dependence: Secondary | ICD-10-CM | POA: Insufficient documentation

## 2015-03-17 DIAGNOSIS — M94261 Chondromalacia, right knee: Secondary | ICD-10-CM | POA: Insufficient documentation

## 2015-03-17 DIAGNOSIS — S83281A Other tear of lateral meniscus, current injury, right knee, initial encounter: Secondary | ICD-10-CM | POA: Insufficient documentation

## 2015-03-17 HISTORY — PX: KNEE ARTHROSCOPY WITH LATERAL MENISECTOMY: SHX6193

## 2015-03-17 HISTORY — DX: Chondromalacia patellae, right knee: M22.41

## 2015-03-17 HISTORY — PX: KNEE ARTHROSCOPY WITH MEDIAL MENISECTOMY: SHX5651

## 2015-03-17 LAB — POCT HEMOGLOBIN-HEMACUE: Hemoglobin: 13.9 g/dL (ref 12.0–15.0)

## 2015-03-17 SURGERY — ARTHROSCOPY, KNEE, WITH MEDIAL MENISCECTOMY
Anesthesia: General | Site: Knee | Laterality: Right

## 2015-03-17 MED ORDER — HYDROMORPHONE HCL 1 MG/ML IJ SOLN: 0.2500 mg | INTRAMUSCULAR | Status: DC | PRN

## 2015-03-17 MED ORDER — GLYCOPYRROLATE 0.2 MG/ML IJ SOLN: 0.2000 mg | Freq: Once | INTRAMUSCULAR | Status: DC | PRN

## 2015-03-17 MED ORDER — OXYCODONE HCL 5 MG PO TABS
5.0000 mg | ORAL_TABLET | Freq: Once | ORAL | Status: AC | PRN
  Administered 2015-03-17: 5 mg via ORAL

## 2015-03-17 MED ORDER — OXYCODONE HCL 5 MG PO TABS
ORAL_TABLET | ORAL | Status: AC
  Filled 2015-03-17: qty 1

## 2015-03-17 MED ORDER — METHYLPREDNISOLONE ACETATE 40 MG/ML IJ SUSP
INTRAMUSCULAR | Status: AC
  Filled 2015-03-17: qty 1

## 2015-03-17 MED ORDER — METOCLOPRAMIDE HCL 5 MG PO TABS: 5.0000 mg | ORAL_TABLET | Freq: Three times a day (TID) | ORAL | Status: DC | PRN

## 2015-03-17 MED ORDER — OXYCODONE HCL 5 MG/5ML PO SOLN: 5.0000 mg | Freq: Once | ORAL | Status: AC | PRN

## 2015-03-17 MED ORDER — OXYCODONE-ACETAMINOPHEN 5-325 MG PO TABS: 1.0000 | ORAL_TABLET | ORAL | Status: DC | PRN

## 2015-03-17 MED ORDER — FENTANYL CITRATE (PF) 100 MCG/2ML IJ SOLN
INTRAMUSCULAR | Status: DC | PRN
  Administered 2015-03-17: 50 ug via INTRAVENOUS
  Administered 2015-03-17: 25 ug via INTRAVENOUS

## 2015-03-17 MED ORDER — LACTATED RINGERS IV SOLN
INTRAVENOUS | Status: DC
  Administered 2015-03-17: 07:00:00 via INTRAVENOUS

## 2015-03-17 MED ORDER — CEFAZOLIN SODIUM-DEXTROSE 2-3 GM-% IV SOLR
2.0000 g | INTRAVENOUS | Status: AC
  Administered 2015-03-17: 2 g via INTRAVENOUS

## 2015-03-17 MED ORDER — DEXAMETHASONE SODIUM PHOSPHATE 4 MG/ML IJ SOLN
INTRAMUSCULAR | Status: DC | PRN
  Administered 2015-03-17: 10 mg via INTRAVENOUS

## 2015-03-17 MED ORDER — KETOROLAC TROMETHAMINE 30 MG/ML IJ SOLN
INTRAMUSCULAR | Status: DC | PRN
  Administered 2015-03-17: 30 mg via INTRAVENOUS

## 2015-03-17 MED ORDER — BUPIVACAINE HCL (PF) 0.25 % IJ SOLN
INTRAMUSCULAR | Status: AC
  Filled 2015-03-17: qty 30

## 2015-03-17 MED ORDER — ONDANSETRON HCL 4 MG/2ML IJ SOLN: 4.0000 mg | Freq: Four times a day (QID) | INTRAMUSCULAR | Status: DC | PRN

## 2015-03-17 MED ORDER — FENTANYL CITRATE (PF) 100 MCG/2ML IJ SOLN
50.0000 ug | INTRAMUSCULAR | Status: DC | PRN
Start: 2015-03-17 — End: 2015-03-17

## 2015-03-17 MED ORDER — METHOCARBAMOL 1000 MG/10ML IJ SOLN: 500.0000 mg | Freq: Four times a day (QID) | INTRAVENOUS | Status: DC | PRN

## 2015-03-17 MED ORDER — PROPOFOL 10 MG/ML IV BOLUS
INTRAVENOUS | Status: DC | PRN
  Administered 2015-03-17: 10 mg via INTRAVENOUS

## 2015-03-17 MED ORDER — CHLORHEXIDINE GLUCONATE 4 % EX LIQD: 60.0000 mL | Freq: Once | CUTANEOUS | Status: DC

## 2015-03-17 MED ORDER — FENTANYL CITRATE (PF) 100 MCG/2ML IJ SOLN
INTRAMUSCULAR | Status: AC
  Filled 2015-03-17: qty 6

## 2015-03-17 MED ORDER — METHYLPREDNISOLONE ACETATE 40 MG/ML IJ SUSP
INTRAMUSCULAR | Status: DC | PRN
  Administered 2015-03-17: 40 mg via INTRA_ARTICULAR

## 2015-03-17 MED ORDER — ONDANSETRON HCL 4 MG PO TABS: 4.0000 mg | ORAL_TABLET | Freq: Four times a day (QID) | ORAL | Status: DC | PRN

## 2015-03-17 MED ORDER — ONDANSETRON HCL 4 MG/2ML IJ SOLN
INTRAMUSCULAR | Status: DC | PRN
  Administered 2015-03-17: 4 mg via INTRAVENOUS

## 2015-03-17 MED ORDER — HYDROMORPHONE HCL 1 MG/ML IJ SOLN: 0.5000 mg | INTRAMUSCULAR | Status: DC | PRN

## 2015-03-17 MED ORDER — BUPIVACAINE HCL (PF) 0.25 % IJ SOLN
INTRAMUSCULAR | Status: DC | PRN
  Administered 2015-03-17: 25 mg

## 2015-03-17 MED ORDER — METOCLOPRAMIDE HCL 5 MG/ML IJ SOLN: 5.0000 mg | Freq: Three times a day (TID) | INTRAMUSCULAR | Status: DC | PRN

## 2015-03-17 MED ORDER — LIDOCAINE HCL (CARDIAC) 20 MG/ML IV SOLN
INTRAVENOUS | Status: DC | PRN
  Administered 2015-03-17: 80 mg via INTRAVENOUS

## 2015-03-17 MED ORDER — METHOCARBAMOL 500 MG PO TABS: 500.0000 mg | ORAL_TABLET | Freq: Four times a day (QID) | ORAL | Status: DC | PRN

## 2015-03-17 MED ORDER — MIDAZOLAM HCL 2 MG/2ML IJ SOLN
INTRAMUSCULAR | Status: AC
  Filled 2015-03-17: qty 2

## 2015-03-17 MED ORDER — MIDAZOLAM HCL 2 MG/2ML IJ SOLN
1.0000 mg | INTRAMUSCULAR | Status: DC | PRN
  Administered 2015-03-17: 2 mg via INTRAVENOUS

## 2015-03-17 MED ORDER — LACTATED RINGERS IV SOLN: INTRAVENOUS | Status: DC

## 2015-03-17 MED ORDER — CEFAZOLIN SODIUM-DEXTROSE 2-3 GM-% IV SOLR
INTRAVENOUS | Status: AC
  Filled 2015-03-17: qty 50

## 2015-03-17 MED ORDER — ONDANSETRON HCL 4 MG PO TABS: 4.0000 mg | ORAL_TABLET | Freq: Three times a day (TID) | ORAL | Status: DC | PRN

## 2015-03-17 MED ORDER — SODIUM CHLORIDE 0.9 % IR SOLN
Status: DC | PRN
  Administered 2015-03-17: 3000 mL

## 2015-03-17 SURGICAL SUPPLY — 40 items
BANDAGE ELASTIC 6 VELCRO ST LF (GAUZE/BANDAGES/DRESSINGS) ×2 IMPLANT
BLADE CUDA 5.5 (BLADE) IMPLANT
BLADE CUDA GRT WHITE 3.5 (BLADE) IMPLANT
BLADE CUTTER GATOR 3.5 (BLADE) ×2 IMPLANT
BLADE CUTTER MENIS 5.5 (BLADE) IMPLANT
BLADE GREAT WHITE 4.2 (BLADE) ×2 IMPLANT
BUR OVAL 4.0 (BURR) IMPLANT
CUTTER MENISCUS  4.2MM (BLADE)
CUTTER MENISCUS 4.2MM (BLADE) IMPLANT
DRAPE ARTHROSCOPY W/POUCH 90 (DRAPES) ×2 IMPLANT
DURAPREP 26ML APPLICATOR (WOUND CARE) ×2 IMPLANT
ELECT MENISCUS 165MM 90D (ELECTRODE) IMPLANT
ELECT REM PT RETURN 9FT ADLT (ELECTROSURGICAL)
ELECTRODE REM PT RTRN 9FT ADLT (ELECTROSURGICAL) IMPLANT
GAUZE SPONGE 4X4 12PLY STRL (GAUZE/BANDAGES/DRESSINGS) ×2 IMPLANT
GAUZE XEROFORM 1X8 LF (GAUZE/BANDAGES/DRESSINGS) ×2 IMPLANT
GLOVE BIO SURGEON STRL SZ 6.5 (GLOVE) ×1 IMPLANT
GLOVE BIOGEL M 7.0 STRL (GLOVE) ×1 IMPLANT
GLOVE BIOGEL PI IND STRL 7.0 (GLOVE) ×1 IMPLANT
GLOVE BIOGEL PI INDICATOR 7.0 (GLOVE) ×3
GLOVE ECLIPSE 7.0 STRL STRAW (GLOVE) ×2 IMPLANT
GLOVE ORTHO TXT STRL SZ7.5 (GLOVE) ×2 IMPLANT
GLOVE SURG ORTHO 8.0 STRL STRW (GLOVE) ×2 IMPLANT
GOWN STRL REUS W/ TWL LRG LVL3 (GOWN DISPOSABLE) ×2 IMPLANT
GOWN STRL REUS W/ TWL XL LVL3 (GOWN DISPOSABLE) ×1 IMPLANT
GOWN STRL REUS W/TWL LRG LVL3 (GOWN DISPOSABLE) ×4
GOWN STRL REUS W/TWL XL LVL3 (GOWN DISPOSABLE) ×2
HOLDER KNEE FOAM BLUE (MISCELLANEOUS) ×2 IMPLANT
IV NS IRRIG 3000ML ARTHROMATIC (IV SOLUTION) ×4 IMPLANT
KNEE WRAP E Z 3 GEL PACK (MISCELLANEOUS) ×2 IMPLANT
MANIFOLD NEPTUNE II (INSTRUMENTS) ×2 IMPLANT
PACK ARTHROSCOPY DSU (CUSTOM PROCEDURE TRAY) ×2 IMPLANT
PACK BASIN DAY SURGERY FS (CUSTOM PROCEDURE TRAY) ×2 IMPLANT
PENCIL BUTTON HOLSTER BLD 10FT (ELECTRODE) IMPLANT
SET ARTHROSCOPY TUBING (MISCELLANEOUS) ×2
SET ARTHROSCOPY TUBING LN (MISCELLANEOUS) ×1 IMPLANT
SUT ETHILON 3 0 PS 1 (SUTURE) ×2 IMPLANT
SUT VIC AB 3-0 FS2 27 (SUTURE) IMPLANT
TOWEL OR 17X24 6PK STRL BLUE (TOWEL DISPOSABLE) ×2 IMPLANT
WATER STERILE IRR 1000ML POUR (IV SOLUTION) ×2 IMPLANT

## 2015-03-17 NOTE — Anesthesia Preprocedure Evaluation (Signed)
Anesthesia Evaluation  Patient identified by MRN, date of birth, ID band Patient awake    Reviewed: Allergy & Precautions, NPO status , Patient's Chart, lab work & pertinent test results  Airway Mallampati: II   Neck ROM: full    Dental   Pulmonary asthma , former smoker,  breath sounds clear to auscultation        Cardiovascular negative cardio ROS  Rhythm:regular Rate:Normal     Neuro/Psych    GI/Hepatic   Endo/Other  Hypothyroidism   Renal/GU      Musculoskeletal   Abdominal   Peds  Hematology   Anesthesia Other Findings   Reproductive/Obstetrics                             Anesthesia Physical Anesthesia Plan  ASA: II  Anesthesia Plan: General   Post-op Pain Management:    Induction: Intravenous  Airway Management Planned: LMA  Additional Equipment:   Intra-op Plan:   Post-operative Plan:   Informed Consent: I have reviewed the patients History and Physical, chart, labs and discussed the procedure including the risks, benefits and alternatives for the proposed anesthesia with the patient or authorized representative who has indicated his/her understanding and acceptance.     Plan Discussed with: CRNA, Anesthesiologist and Surgeon  Anesthesia Plan Comments:         Anesthesia Quick Evaluation

## 2015-03-17 NOTE — Interval H&P Note (Signed)
History and Physical Interval Note:  03/17/2015 7:38 AM  Michelle Nolan  has presented today for surgery, with the diagnosis of chondromalacia patellae right knee other meniscus derangement posterior horn of medial meniscus right knee   The various methods of treatment have been discussed with the patient and family. After consideration of risks, benefits and other options for treatment, the patient has consented to  Procedure(s): RIGHT KNEE ARTHROSCOPY CHONDROPLASTY WITH MEDIAL MENISCECTOMY (Right) as a surgical intervention .  The patient's history has been reviewed, patient examined, no change in status, stable for surgery.  I have reviewed the patient's chart and labs.  Questions were answered to the patient's satisfaction.     Inola Lisle F

## 2015-03-17 NOTE — Anesthesia Postprocedure Evaluation (Signed)
Anesthesia Post Note  Patient: Simmie DaviesJudy A Thole  Procedure(s) Performed: Procedure(s) (LRB): RIGHT KNEE ARTHROSCOPY CHONDROPLASTY WITH MEDIAL MENISCECTOMY (Right) KNEE ARTHROSCOPY WITH PARTIAL LATERAL MENISECTOMY (Right)  Anesthesia type: General  Patient location: PACU  Post pain: Pain level controlled and Adequate analgesia  Post assessment: Post-op Vital signs reviewed, Patient's Cardiovascular Status Stable, Respiratory Function Stable, Patent Airway and Pain level controlled  Last Vitals:  Filed Vitals:   03/17/15 0913  BP: 130/94  Pulse: 71  Temp: 36.6 C  Resp: 18    Post vital signs: Reviewed and stable  Level of consciousness: awake, alert  and oriented  Complications: No apparent anesthesia complications

## 2015-03-17 NOTE — Anesthesia Procedure Notes (Signed)
Procedure Name: LMA Insertion Performed by: York GricePEARSON, Charleene Callegari W Pre-anesthesia Checklist: Patient identified, Timeout performed, Emergency Drugs available, Suction available and Patient being monitored Patient Re-evaluated:Patient Re-evaluated prior to inductionOxygen Delivery Method: Circle system utilized Preoxygenation: Pre-oxygenation with 100% oxygen Intubation Type: IV induction Ventilation: Mask ventilation without difficulty LMA: LMA inserted LMA Size: 4.0 Tube type: Oral Tube size: 4.0 mm Number of attempts: 1 Placement Confirmation: positive ETCO2 and breath sounds checked- equal and bilateral Tube secured with: Tape Dental Injury: Teeth and Oropharynx as per pre-operative assessment

## 2015-03-17 NOTE — Transfer of Care (Signed)
Immediate Anesthesia Transfer of Care Note  Patient: Simmie DaviesJudy A Stormes  Procedure(s) Performed: Procedure(s): RIGHT KNEE ARTHROSCOPY CHONDROPLASTY WITH MEDIAL MENISCECTOMY (Right) KNEE ARTHROSCOPY WITH PARTIAL LATERAL MENISECTOMY (Right)  Patient Location: PACU  Anesthesia Type:General  Level of Consciousness: awake and sedated  Airway & Oxygen Therapy: Patient Spontanous Breathing and Patient connected to face mask oxygen  Post-op Assessment: Report given to RN and Post -op Vital signs reviewed and stable  Post vital signs: Reviewed and stable  Last Vitals:  Filed Vitals:   03/17/15 0626  BP: 120/54  Pulse: 62  Temp: 36.7 C  Resp: 18    Complications: No apparent anesthesia complications

## 2015-03-17 NOTE — Discharge Instructions (Signed)

## 2015-03-18 ENCOUNTER — Encounter (HOSPITAL_BASED_OUTPATIENT_CLINIC_OR_DEPARTMENT_OTHER): Payer: Self-pay | Admitting: Orthopedic Surgery

## 2015-03-18 NOTE — Op Note (Signed)
NAME:  Mertha FindersFREDERICK, Merna              ACCOUNT NO.:  [id number]  MEDICAL RECORD NO.:  [id number]  LOCATION:                                FACILITY:  [facility]  PHYSICIAN:  Loreta Aveaniel F. Olinda Nola, M.D. DATE OF BIRTH:  January 05, 1956  DATE OF PROCEDURE:  03/17/2015 DATE OF DISCHARGE:  03/17/2015                              OPERATIVE REPORT   PREOPERATIVE DIAGNOSIS:  Right knee medial meniscus tear.  POSTOPERATIVE DIAGNOSIS:  Right knee medial meniscus tear with complex displaced tear, medial meniscus.  Also, circumferential complex tearing lateral meniscus.  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  DESCRIPTION OF PROCEDURE:  Right knee exam under anesthesia and arthroscopy.  Partial medial and lateral meniscectomy.  Chondroplasty, patellofemoral joint.  SURGEON:  Loreta Aveaniel F. Alexande Sheerin, M.D.  ASSISTANT:  Mikey KirschnerLindsey Stanberry, P.A.  ANESTHESIA:  General.  ESTIMATED BLOOD LOSS:  Minimal.  SPECIMENS:  None.  CULTURES:  None.  COMPLICATIONS:  None.  DRESSINGS:  Soft compressive.  TOURNIQUET:  Not employed.  DESCRIPTION OF PROCEDURE:  The patient was brought to the operating room, placed on the operating table in supine position.  After adequate anesthesia had been obtained, knee examined.  Full motion and stable knee.  Leg holder applied.  Leg prepped and draped in usual sterile fashion.  Two portals, one each medial and lateral parapatellar. Arthroscope introduced, knee distended and inspected.  Some focal grade 2 changes throughout the peak of the patella debrided.  Tracking good, no tethering.  ACL intact.  Medial and lateral compartment, some mild changes, nothing extreme.  Circumferential marked tearing lateral meniscus.  This was saucerized down all the way around, leaving about 50% of the meniscus at completion.  Medially, there was a displaced tear with a fragment flipped underneath the meniscus and in the inferior gutter.  The fragment removed.  Complex tearing  posterior third taken down to a stable rim, tapered into remaining meniscus.  The entire knee examined.  No other findings were appreciated.  Instruments and fluids were removed.  Portals were closed with nylon.  Intra-articular injection, Depo-Medrol and Marcaine. Sterile compressive dressing applied.  Anesthesia reversed.  Brought to the recovery room.  Tolerated the surgery well.  No complications.     Loreta Aveaniel F. Khalea Ventura, M.D.     DFM/MEDQ  D:  03/17/2015  T:  03/18/2015  Job:  [id number]

## 2015-04-05 ENCOUNTER — Other Ambulatory Visit: Payer: Self-pay

## 2015-04-05 DIAGNOSIS — Z9882 Breast implant status: Secondary | ICD-10-CM

## 2015-04-05 DIAGNOSIS — Z1231 Encounter for screening mammogram for malignant neoplasm of breast: Secondary | ICD-10-CM

## 2015-04-28 ENCOUNTER — Other Ambulatory Visit: Payer: Self-pay | Admitting: Internal Medicine

## 2015-04-28 DIAGNOSIS — M8580 Other specified disorders of bone density and structure, unspecified site: Secondary | ICD-10-CM

## 2015-04-28 DIAGNOSIS — T380X5A Adverse effect of glucocorticoids and synthetic analogues, initial encounter: Secondary | ICD-10-CM

## 2015-06-02 ENCOUNTER — Ambulatory Visit
Admission: RE | Admit: 2015-06-02 | Discharge: 2015-06-02 | Disposition: A | Discharge status: Home / Self Care | Payer: BLUE CROSS/BLUE SHIELD | Source: Ambulatory Visit | Attending: Internal Medicine | Admitting: Internal Medicine

## 2015-06-02 ENCOUNTER — Other Ambulatory Visit: Payer: Self-pay | Admitting: Internal Medicine

## 2015-06-02 ENCOUNTER — Ambulatory Visit
Admission: RE | Admit: 2015-06-02 | Discharge: 2015-06-02 | Disposition: A | Discharge status: Home / Self Care | Payer: BLUE CROSS/BLUE SHIELD | Source: Ambulatory Visit

## 2015-06-02 DIAGNOSIS — T380X5A Adverse effect of glucocorticoids and synthetic analogues, initial encounter: Secondary | ICD-10-CM

## 2015-06-02 DIAGNOSIS — M949 Disorder of cartilage, unspecified: Secondary | ICD-10-CM

## 2015-06-02 DIAGNOSIS — M8580 Other specified disorders of bone density and structure, unspecified site: Secondary | ICD-10-CM

## 2015-06-02 DIAGNOSIS — Z9882 Breast implant status: Secondary | ICD-10-CM

## 2015-06-02 DIAGNOSIS — Z1231 Encounter for screening mammogram for malignant neoplasm of breast: Secondary | ICD-10-CM

## 2015-06-02 DIAGNOSIS — M899 Disorder of bone, unspecified: Secondary | ICD-10-CM

## 2016-05-16 ENCOUNTER — Other Ambulatory Visit: Payer: Self-pay | Admitting: Obstetrics and Gynecology

## 2016-05-16 DIAGNOSIS — Z1231 Encounter for screening mammogram for malignant neoplasm of breast: Secondary | ICD-10-CM

## 2016-06-06 ENCOUNTER — Ambulatory Visit: Payer: BLUE CROSS/BLUE SHIELD

## 2016-07-05 ENCOUNTER — Ambulatory Visit: Payer: BLUE CROSS/BLUE SHIELD

## 2016-07-11 ENCOUNTER — Ambulatory Visit
Admission: RE | Admit: 2016-07-11 | Discharge: 2016-07-11 | Disposition: A | Discharge status: Home / Self Care | Payer: BLUE CROSS/BLUE SHIELD | Source: Ambulatory Visit | Attending: Obstetrics and Gynecology | Admitting: Obstetrics and Gynecology

## 2016-07-11 DIAGNOSIS — Z1231 Encounter for screening mammogram for malignant neoplasm of breast: Secondary | ICD-10-CM

## 2016-09-14 ENCOUNTER — Other Ambulatory Visit: Payer: Self-pay | Admitting: Otolaryngology

## 2016-09-14 DIAGNOSIS — R1314 Dysphagia, pharyngoesophageal phase: Secondary | ICD-10-CM

## 2016-10-05 ENCOUNTER — Ambulatory Visit
Admission: RE | Admit: 2016-10-05 | Discharge: 2016-10-05 | Disposition: A | Discharge status: Home / Self Care | Payer: BLUE CROSS/BLUE SHIELD | Source: Ambulatory Visit | Attending: Otolaryngology | Admitting: Otolaryngology

## 2016-10-05 DIAGNOSIS — R1314 Dysphagia, pharyngoesophageal phase: Secondary | ICD-10-CM

## 2016-12-19 ENCOUNTER — Other Ambulatory Visit: Payer: BLUE CROSS/BLUE SHIELD

## 2016-12-25 ENCOUNTER — Ambulatory Visit (INDEPENDENT_AMBULATORY_CARE_PROVIDER_SITE_OTHER)
Admission: RE | Admit: 2016-12-25 | Discharge: 2016-12-25 | Disposition: A | Discharge status: Home / Self Care | Payer: BLUE CROSS/BLUE SHIELD | Source: Ambulatory Visit | Attending: Pulmonary Disease | Admitting: Pulmonary Disease

## 2016-12-25 ENCOUNTER — Encounter: Payer: Self-pay | Admitting: Pulmonary Disease

## 2016-12-25 ENCOUNTER — Ambulatory Visit (INDEPENDENT_AMBULATORY_CARE_PROVIDER_SITE_OTHER): Payer: BLUE CROSS/BLUE SHIELD | Admitting: Pulmonary Disease

## 2016-12-25 VITALS — BP 110/70 | HR 68 | Ht 65.0 in | Wt 128.8 lb

## 2016-12-25 DIAGNOSIS — R0602 Shortness of breath: Secondary | ICD-10-CM | POA: Diagnosis not present

## 2016-12-25 DIAGNOSIS — K219 Gastro-esophageal reflux disease without esophagitis: Secondary | ICD-10-CM

## 2016-12-25 NOTE — Assessment & Plan Note (Signed)
Barium study done in December 2017 with moderate reflux disease. Patient has modified her diet and her symptoms are better. She wants to avoid medicines.

## 2016-12-25 NOTE — Patient Instructions (Signed)
It was a pleasure taking care of you today!  We will schedule you for a chest x-ray today as well as a breathing test.  We will call you once with results.   Return to clinic as needed.

## 2016-12-25 NOTE — Assessment & Plan Note (Signed)
Patient is here for chronic exertional dyspnea. She has symptom for 5 years now. She was seen here in the office in 2012. Work up was unremarkable. Specifically, her "shortness of breath" is whenever  she talks.  Her symptoms have been stable.   She smoked for 7 yrs, 1/2 PPD in her teens. She has not been diagnosed with asthma or copd. She uses marijuana every now and then.  She vapes it occasionally.    PFT done in November 2012 showed normal results. FEV1 of 2.77 or 112% of predicted. Diffusion capacity was normal.  Chest x-ray done in April 2015 was unremarkable.   Her "SOB" started in 2012. 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  She does not get winded with doing more than ADLs. As mentioned, she is very physically active. She water skis. She runs/jogs 2 miles 2x/week and tolerates that well.   She also complains of choking but this is random. Usually happens when she swallows and talks at the same time.  He had a barium December 2012 which showed her swallowing was okay but was diagnosed with reflux disease. Since that time, she has modified her dietand choking is some better.   She has chronic anxiety which is stable.  Denies depression.   Assessment:  I think at the end of today, patient is just  too cautious as far as he dyspnea is concerned. She is very physically active and only gets "winded" when she talks.  She uses marijuana occasionally, vapes it. She is probably paranoid as far as what marijuana is doing to her lungs.  I doubt she will have asthma or copd.  Certainly, GERD can make her "SOB" but she has modified her diet and her GERD is better.   Plan : 1. Advised re: re stopping marijuana use.  2. Plan for PFT and CXR.  If abnormal, will need to call pt.  3. Albuterol as needed.  4. Rx GER with diet changes.  5. Pt to call back if with worsening of sx.

## 2016-12-25 NOTE — Progress Notes (Addendum)
Subjective:    Patient ID: Michelle Nolan, female    DOB: [birth year], 61 y.o.   MRN: [id number]  HPI  Patient is here for chronic exertional dyspnea. She has symptom for 5 years now. She was seen here in the office in 2012. Work up was unremarkable. Specifically, her "shortness of breath" is whenever  She talks.  Her symptoms have been stable.   She smoked for 7 yrs, 1/2 PPD in her teens. She has not been diagnosed with asthma or copd. She uses marijuana every now and then.  She vapes it occasionally.    PFT done in November 2012 showed normal results. FEV1 of 2.77 or 112% of predicted. Diffusion capacity was normal.  Chest x-ray done in April 2015 was unremarkable.   Her "SOB" started in 2012. 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  She does not get winded with doing more than ADLs. As mentioned, she is very physically active. She water skis. She runs/jogs 2 miles 2x/week and tolerates that well.   She also complains of choking but this is random. Usually happens when she swallows and talks at the same time.  He had a barium December 2012 which showed her swallowing was okay but was diagnosed with reflux disease. Since that time, she has modified her dietand choking is some better.   She has chronic anxiety which is stable.  Denies depression.   Review of Systems  Constitutional: Negative.  Negative for fever and unexpected weight change.  HENT: Positive for trouble swallowing. Negative for congestion, dental problem, ear pain, nosebleeds, postnasal drip, rhinorrhea, sinus pressure, sneezing and sore throat.   Eyes: Negative.  Negative for redness and itching.  Respiratory: Positive for cough and shortness of breath. Negative for chest tightness and wheezing.   Cardiovascular: Negative.  Negative for palpitations and leg swelling.  Gastrointestinal: Negative.  Negative for nausea and vomiting.  Genitourinary: Negative.  Negative for dysuria.  Musculoskeletal:  Negative.  Negative for joint swelling.  Skin: Negative.  Negative for rash.  Neurological: Negative.  Negative for headaches.  Hematological: Negative.  Does not bruise/bleed easily.  Psychiatric/Behavioral: Negative.  Negative for dysphoric mood. The patient is not nervous/anxious.    Past Medical History:  Diagnosis Date  . Chondromalacia of right patella   . Hypothyroidism    (-) CA, DVT  Family History  Problem Relation Age of Onset  . COPD Father      Past Surgical History:  Procedure Laterality Date  . BREAST ENHANCEMENT SURGERY Bilateral   . CESAREAN SECTION     x2  . KNEE ARTHROSCOPY Left   . KNEE ARTHROSCOPY WITH LATERAL MENISECTOMY Right 03/17/2015   Procedure: KNEE ARTHROSCOPY WITH PARTIAL LATERAL MENISECTOMY;  Surgeon: Mckinley Jewelaniel Murphy, MD;  Location: [facility];  Service: Orthopedics;  Laterality: Right;  . KNEE ARTHROSCOPY WITH MEDIAL MENISECTOMY Right 03/17/2015   Procedure: RIGHT KNEE ARTHROSCOPY CHONDROPLASTY WITH MEDIAL MENISCECTOMY;  Surgeon: Mckinley Jewelaniel Murphy, MD;  Location: [facility];  Service: Orthopedics;  Laterality: Right;  . WRIST SURGERY Left     Social History   Social History  . Marital status: Married    Spouse name: N/A  . Number of children: N/A  . Years of education: N/A   Occupational History  . Not on file.   Social History Main Topics  . Smoking status: Former Smoker    Packs/day: 0.30    Years: 5.00    Quit date: 10/15/1978  .  Smokeless tobacco: Never Used  . Alcohol use Yes     Comment: social  . Drug use: No  . Sexual activity: Yes   Other Topics Concern  . Not on file   Social History Narrative  . No narrative on file   She is married, with 2 children. Lives in [place]. Owns and runs a [employer].   Allergies  Allergen Reactions  . Codeine Nausea Only  . Sulfonamide Derivatives     REACTION: rash     Outpatient Medications Prior to Visit  Medication Sig Dispense Refill  . progesterone  (PROMETRIUM) 100 MG capsule Take 100 mg by mouth daily.    Marland Kitchen SYNTHROID 25 MCG tablet Take 1 tablet by mouth daily.    . vitamin C (ASCORBIC ACID) 500 MG tablet Take 500 mg by mouth daily.      Marland Kitchen VITAMIN D, CHOLECALCIFEROL, PO Take 1 tablet by mouth daily.      Marland Kitchen conjugated estrogens (PREMARIN) vaginal cream Place 1 Applicatorful vaginally daily.    Marland Kitchen FISH OIL-COENZYME Q10 PO Take by mouth.    Marland Kitchen ibuprofen (ADVIL,MOTRIN) 200 MG tablet Take 200 mg by mouth every 6 (six) hours as needed.    . ondansetron (ZOFRAN) 4 MG tablet Take 1 tablet (4 mg total) by mouth every 8 (eight) hours as needed for nausea or vomiting. (Patient not taking: Reported on 12/25/2016) 40 tablet 0  . oxyCODONE-acetaminophen (ROXICET) 5-325 MG per tablet Take 1-2 tablets by mouth every 4 (four) hours as needed. (Patient not taking: Reported on 12/25/2016) 30 tablet 0   No facility-administered medications prior to visit.    Meds ordered this encounter  Medications  . Calcium Carbonate-Vit D-Min (CALCIUM 1200 PO)    Sig: Take 1 tablet by mouth.        Objective:   Physical Exam  Vitals:  Vitals:   12/25/16 1124  BP: 110/70  Pulse: 68  SpO2: 95%  Weight: 128 lb 12.8 oz (58.4 kg)  Height: 5\' 5"  (1.651 m)    Constitutional/General:  Pleasant, well-nourished, well-developed, not in any distress,  Comfortably seating.  Well kempt  Body mass index is 21.43 kg/m. Wt Readings from Last 3 Encounters:  12/25/16 128 lb 12.8 oz (58.4 kg)  03/17/15 131 lb (59.4 kg)  08/09/11 135 lb (61.2 kg)    HEENT: Pupils equal and reactive to light and accommodation. Anicteric sclerae. Normal nasal mucosa.   No oral  lesions,  mouth clear,  oropharynx clear, no postnasal drip. (-) Oral thrush. No dental caries.  Airway - Mallampati class III  Neck: No masses. Midline trachea. No JVD, (-) LAD. (-) bruits appreciated.  Respiratory/Chest: Grossly normal chest. (-) deformity. (-) Accessory muscle use.  Symmetric expansion. (-)  Tenderness on palpation.  Resonant on percussion.  Diminished BS on both lower lung zones. (-) wheezing, crackles, rhonchi (-) egophony  Cardiovascular: Regular rate and  rhythm, heart sounds normal, no murmur or gallops, no peripheral edema  Gastrointestinal:  Normal bowel sounds. Soft, non-tender. No hepatosplenomegaly.  (-) masses.   Musculoskeletal:  Normal muscle tone. Normal gait.   Extremities: Grossly normal. (-) clubbing, cyanosis.  (-) edema  Skin: (-) rash,lesions seen.   Neurological/Psychiatric : alert, oriented to time, place, person. Normal mood and affect          Assessment & Plan:  Shortness of breath Patient is here for chronic exertional dyspnea. She has symptom for 5 years now. She was seen here in the office in 2012.  Work up was unremarkable. Specifically, her "shortness of breath" is whenever  she talks.  Her symptoms have been stable.   She smoked for 7 yrs, 1/2 PPD in her teens. She has not been diagnosed with asthma or copd. She uses marijuana every now and then.  She vapes it occasionally.    PFT done in November 2012 showed normal results. FEV1 of 2.77 or 112% of predicted. Diffusion capacity was normal.  Chest x-ray done in April 2015 was unremarkable.   Her "SOB" started in 2012. 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  She does not get winded with doing more than ADLs. As mentioned, she is very physically active. She water skis. She runs/jogs 2 miles 2x/week and tolerates that well.   She also complains of choking but this is random. Usually happens when she swallows and talks at the same time.  He had a barium December 2012 which showed her swallowing was okay but was diagnosed with reflux disease. Since that time, she has modified her dietand choking is some better.   She has chronic anxiety which is stable.  Denies depression.   Assessment:  I think at the end of today, patient is just  too cautious as far as he  dyspnea is concerned. She is very physically active and only gets "winded" when she talks.  She uses marijuana occasionally, vapes it. She is probably paranoid as far as what marijuana is doing to her lungs.  I doubt she will have asthma or copd.  Certainly, GERD can make her "SOB" but she has modified her diet and her GERD is better.   Plan : 1. Advised re: re stopping marijuana use.  2. Plan for PFT and CXR.  If abnormal, will need to call pt.  3. Albuterol as needed.  4. Rx GER with diet changes.  5. Pt to call back if with worsening of sx.    GERD (gastroesophageal reflux disease) Barium study done in December 2017 with moderate reflux disease. Patient has modified her diet and her symptoms are better. She wants to avoid medicines.       Patient will follow up with me as needed.     Pollie Meyer, MD 12/25/2016   12:08 PM Pulmonary and Critical Care Medicine  HealthCare Pager: [phone] Office: [phone], Fax: [phone]

## 2017-01-09 ENCOUNTER — Other Ambulatory Visit: Payer: BLUE CROSS/BLUE SHIELD

## 2017-02-14 ENCOUNTER — Ambulatory Visit (INDEPENDENT_AMBULATORY_CARE_PROVIDER_SITE_OTHER): Payer: BLUE CROSS/BLUE SHIELD | Admitting: Pulmonary Disease

## 2017-02-14 DIAGNOSIS — R0602 Shortness of breath: Secondary | ICD-10-CM | POA: Diagnosis not present

## 2017-02-14 NOTE — Progress Notes (Signed)
PFT done today. 

## 2017-02-26 ENCOUNTER — Telehealth: Payer: Self-pay | Admitting: Pulmonary Disease

## 2017-02-26 NOTE — Telephone Encounter (Signed)
AD---   Please advise once you have reviewed the results of the PFT on this pt in [place].  She would like a copy of this mailed to her.  thanks

## 2017-02-27 LAB — PULMONARY FUNCTION TEST
DL/VA % pred: 75 %
DL/VA: 3.69 ml/min/mmHg/L
DLCO cor % pred: 73 %
DLCO cor: 18.81 ml/min/mmHg
DLCO unc % pred: 74 %
DLCO unc: 18.98 ml/min/mmHg
FEF 25-75 Post: 1.45 L/sec
FEF 25-75 Pre: 1.68 L/sec
FEF2575-%Change-Post: -13 %
FEF2575-%Pred-Post: 61 %
FEF2575-%Pred-Pre: 71 %
FEV1-%Change-Post: 3 %
FEV1-%Pred-Post: 94 %
FEV1-%Pred-Pre: 91 %
FEV1-Post: 2.48 L
FEV1-Pre: 2.39 L
FEV1FVC-%Change-Post: 14 %
FEV1FVC-%Pred-Pre: 92 %
FEV6-%Change-Post: -8 %
FEV6-%Pred-Post: 92 %
FEV6-%Pred-Pre: 100 %
FEV6-Post: 3.03 L
FEV6-Pre: 3.3 L
FEV6FVC-%Change-Post: 0 %
FEV6FVC-%Pred-Post: 104 %
FEV6FVC-%Pred-Pre: 103 %
FVC-%Change-Post: -8 %
FVC-%Pred-Post: 89 %
FVC-%Pred-Pre: 98 %
FVC-Post: 3.03 L
FVC-Pre: 3.33 L
Post FEV1/FVC ratio: 82 %
Post FEV6/FVC ratio: 100 %
Pre FEV1/FVC ratio: 72 %
Pre FEV6/FVC Ratio: 99 %
RV % pred: 113 %
RV: 2.33 L
TLC % pred: 100 %
TLC: 5.24 L

## 2017-02-27 NOTE — Telephone Encounter (Signed)
pls tell the pt the breathing test was normal.  CXR was normal.  Is she doing OK?  Thanks!  J. Alexis FrockAngelo A de Dios, MD 02/27/2017, 8:14 AM [facility] Pager [phone] After 3 pm or if no answer, call [phone]

## 2017-02-27 NOTE — Telephone Encounter (Signed)
lmtcb x1 for pt. 

## 2017-03-01 NOTE — Telephone Encounter (Signed)
I have called and lmom to make the pt aware that these tests were normal per AD.  I have mailed a copy of the PFT to the pt.

## 2017-06-04 ENCOUNTER — Other Ambulatory Visit (HOSPITAL_BASED_OUTPATIENT_CLINIC_OR_DEPARTMENT_OTHER): Payer: Self-pay

## 2017-06-04 DIAGNOSIS — G4700 Insomnia, unspecified: Secondary | ICD-10-CM

## 2017-06-04 DIAGNOSIS — R0683 Snoring: Secondary | ICD-10-CM

## 2017-06-18 ENCOUNTER — Ambulatory Visit (HOSPITAL_BASED_OUTPATIENT_CLINIC_OR_DEPARTMENT_OTHER): Payer: BLUE CROSS/BLUE SHIELD | Attending: Obstetrics and Gynecology | Admitting: Internal Medicine

## 2017-06-18 VITALS — Ht 65.0 in | Wt 125.0 lb

## 2017-06-18 DIAGNOSIS — R0683 Snoring: Secondary | ICD-10-CM

## 2017-06-18 DIAGNOSIS — G4733 Obstructive sleep apnea (adult) (pediatric): Secondary | ICD-10-CM | POA: Diagnosis not present

## 2017-06-18 DIAGNOSIS — G4700 Insomnia, unspecified: Secondary | ICD-10-CM | POA: Diagnosis present

## 2017-06-29 DIAGNOSIS — R0683 Snoring: Secondary | ICD-10-CM | POA: Diagnosis not present

## 2017-06-29 NOTE — Procedures (Signed)
   Patient Name: Michelle Nolan, Michelle Nolan Date: 06/18/2017 Gender: Female D.O.B: 1956-05-27 Age (years): 61 Referring Provider: Philip Aspen DO Height (inches): 65 Interpreting Physician: Jetty Duhamel MD, ABSM Weight (lbs): 65 RPSGT:  Sink BMI: 11 MRN: [id number] Neck Size: 14.00 CLINICAL INFORMATION Sleep Study Type: HST  Indication for sleep study: Insomnia, Snoring  Epworth Sleepiness Score: 3  SLEEP STUDY TECHNIQUE A multi-channel overnight portable sleep study was performed. The channels recorded were: nasal airflow, thoracic respiratory movement, and oxygen saturation with a pulse oximetry. Snoring was also monitored.  MEDICATIONS Patient self administered medications include: none reported.  SLEEP ARCHITECTURE Patient was studied for 378.5 minutes. The sleep efficiency was 100.0 % and the patient was supine for 14.9%. The arousal index was 0.0 per hour.  RESPIRATORY PARAMETERS The overall AHI was 17.4 per hour, with a central apnea index of 0.0 per hour.  The oxygen nadir was 88% during sleep.  CARDIAC DATA Mean heart rate during sleep was 58.1 bpm.  IMPRESSIONS - Moderate obstructive sleep apnea occurred during this study (AHI = 17.4/h). - No significant central sleep apnea occurred during this study (CAI = 0.0/h). - Mild oxygen desaturation was noted during this study (Min O2 = 88%). - Patient snored .  DIAGNOSIS - Obstructive Sleep Apnea (327.23 [G47.33 ICD-10])  RECOMMENDATIONS - CPAP titration is usually first choice. Other options, such as a fitted oral appliance or ENT evaluation, would be based on clinical judgment. - Be careful with alcohol, sedatives and other CNS depressants that may worsen sleep apnea and disrupt normal sleep architecture. - Sleep hygiene should be reviewed to assess factors that may improve sleep quality. - Weight management and regular exercise should be initiated or continued.  [Electronically signed]  06/29/2017 10:56 AM  Jetty Duhamel MD, ABSM Diplomate, American Board of Sleep Medicine   NPI: [id number] Waymon Budge Diplomate, American Board of Sleep Medicine  ELECTRONICALLY SIGNED ON:  06/29/2017, 10:53 AM [facility] PH: [phone]   FX: [phone] ACCREDITED BY THE AMERICAN ACADEMY OF SLEEP MEDICINE

## 2017-08-27 ENCOUNTER — Ambulatory Visit: Payer: BLUE CROSS/BLUE SHIELD | Admitting: Pulmonary Disease

## 2017-09-02 ENCOUNTER — Other Ambulatory Visit: Payer: Self-pay | Admitting: Obstetrics and Gynecology

## 2017-09-02 DIAGNOSIS — Z1231 Encounter for screening mammogram for malignant neoplasm of breast: Secondary | ICD-10-CM

## 2017-09-25 ENCOUNTER — Other Ambulatory Visit: Payer: Self-pay | Admitting: Family Medicine

## 2017-09-25 DIAGNOSIS — E041 Nontoxic single thyroid nodule: Secondary | ICD-10-CM

## 2017-09-27 ENCOUNTER — Ambulatory Visit
Admission: RE | Admit: 2017-09-27 | Discharge: 2017-09-27 | Disposition: A | Discharge status: Home / Self Care | Payer: BLUE CROSS/BLUE SHIELD | Source: Ambulatory Visit | Attending: Family Medicine | Admitting: Family Medicine

## 2017-09-27 DIAGNOSIS — E041 Nontoxic single thyroid nodule: Secondary | ICD-10-CM

## 2017-10-16 ENCOUNTER — Ambulatory Visit
Admission: RE | Admit: 2017-10-16 | Discharge: 2017-10-16 | Disposition: A | Discharge status: Home / Self Care | Payer: BLUE CROSS/BLUE SHIELD | Source: Ambulatory Visit | Attending: Obstetrics and Gynecology | Admitting: Obstetrics and Gynecology

## 2017-10-16 DIAGNOSIS — Z1231 Encounter for screening mammogram for malignant neoplasm of breast: Secondary | ICD-10-CM

## 2017-11-14 ENCOUNTER — Other Ambulatory Visit: Payer: Self-pay | Admitting: Family Medicine

## 2017-11-14 DIAGNOSIS — R911 Solitary pulmonary nodule: Secondary | ICD-10-CM

## 2017-11-19 ENCOUNTER — Other Ambulatory Visit: Payer: Self-pay | Admitting: Family Medicine

## 2017-11-20 ENCOUNTER — Ambulatory Visit
Admission: RE | Admit: 2017-11-20 | Discharge: 2017-11-20 | Disposition: A | Discharge status: Home / Self Care | Payer: BLUE CROSS/BLUE SHIELD | Source: Ambulatory Visit | Attending: Family Medicine | Admitting: Family Medicine

## 2017-11-20 ENCOUNTER — Other Ambulatory Visit: Payer: Self-pay | Admitting: Family Medicine

## 2017-11-20 ENCOUNTER — Other Ambulatory Visit (HOSPITAL_COMMUNITY)
Admission: RE | Admit: 2017-11-20 | Discharge: 2017-11-20 | Disposition: A | Discharge status: Home / Self Care | Payer: BLUE CROSS/BLUE SHIELD | Source: Ambulatory Visit | Attending: Student | Admitting: Student

## 2017-11-20 DIAGNOSIS — E041 Nontoxic single thyroid nodule: Secondary | ICD-10-CM

## 2017-11-20 DIAGNOSIS — E042 Nontoxic multinodular goiter: Secondary | ICD-10-CM | POA: Insufficient documentation

## 2017-11-21 ENCOUNTER — Inpatient Hospital Stay
Admission: RE | Admit: 2017-11-21 | Discharge: 2017-11-21 | Disposition: A | Discharge status: Home / Self Care | Payer: BLUE CROSS/BLUE SHIELD | Source: Ambulatory Visit | Attending: Family Medicine | Admitting: Family Medicine

## 2017-11-26 ENCOUNTER — Ambulatory Visit: Payer: BLUE CROSS/BLUE SHIELD | Admitting: Pulmonary Disease

## 2018-01-02 ENCOUNTER — Inpatient Hospital Stay: Admission: RE | Admit: 2018-01-02 | Payer: BLUE CROSS/BLUE SHIELD | Source: Ambulatory Visit

## 2018-01-02 ENCOUNTER — Ambulatory Visit
Admission: RE | Admit: 2018-01-02 | Discharge: 2018-01-02 | Disposition: A | Discharge status: Home / Self Care | Payer: Self-pay | Source: Ambulatory Visit | Attending: Family Medicine | Admitting: Family Medicine

## 2018-01-02 ENCOUNTER — Other Ambulatory Visit (HOSPITAL_BASED_OUTPATIENT_CLINIC_OR_DEPARTMENT_OTHER): Payer: Self-pay

## 2018-01-02 DIAGNOSIS — G4733 Obstructive sleep apnea (adult) (pediatric): Secondary | ICD-10-CM

## 2018-01-02 DIAGNOSIS — R911 Solitary pulmonary nodule: Secondary | ICD-10-CM

## 2018-01-02 MED ORDER — IOPAMIDOL (ISOVUE-300) INJECTION 61%
75.0000 mL | Freq: Once | INTRAVENOUS | Status: AC | PRN
  Administered 2018-01-02: 75 mL via INTRAVENOUS

## 2018-01-24 ENCOUNTER — Ambulatory Visit (HOSPITAL_BASED_OUTPATIENT_CLINIC_OR_DEPARTMENT_OTHER): Payer: BLUE CROSS/BLUE SHIELD | Attending: Obstetrics and Gynecology | Admitting: Internal Medicine

## 2018-01-24 DIAGNOSIS — G4730 Sleep apnea, unspecified: Secondary | ICD-10-CM | POA: Diagnosis present

## 2018-01-24 DIAGNOSIS — G4733 Obstructive sleep apnea (adult) (pediatric): Secondary | ICD-10-CM | POA: Insufficient documentation

## 2018-02-03 DIAGNOSIS — G4733 Obstructive sleep apnea (adult) (pediatric): Secondary | ICD-10-CM

## 2018-02-03 NOTE — Procedures (Signed)
  Patient Name: Michelle Nolan, Michelle Nolan Study Date: 01/24/2018 Gender: Female D.O.B: 1956-09-06 Age (years): 62 Referring Provider: Philip AspenSidney Callahan DO Height (inches): 65 Interpreting Physician: Jetty Duhamellinton Chinyere Galiano MD, ABSM Weight (lbs): 65 RPSGT: [place], [place] BMI: 11 MRN: [id number] Neck Size: 14.00  CLINICAL INFORMATION Sleep Study Type: HST Indication for sleep study: OSA  Epworth Sleepiness Score: 3  Most recent polysomnogram dated 06/18/2017 revealed an AHI of 17.4/h and RDI of 17.4/h.  SLEEP STUDY TECHNIQUE A multi-channel overnight portable sleep study was performed. The channels recorded were: nasal airflow, thoracic respiratory movement, and oxygen saturation with a pulse oximetry. Snoring was also monitored.  MEDICATIONS Patient self administered medications include: none reported.  SLEEP ARCHITECTURE Patient was studied for 428.5 minutes. The sleep efficiency was 100.0 % and the patient was supine for 1.3%. The arousal index was 0.0 per hour.  RESPIRATORY PARAMETERS The overall AHI was 8.1 per hour, with a central apnea index of 0.0 per hour.  The oxygen nadir was 86% during sleep.  CARDIAC DATA Mean heart rate during sleep was 65.5 bpm.  IMPRESSIONS - Mild obstructive sleep apnea occurred during this study (AHI = 8.1/h). - No significant central sleep apnea occurred during this study (CAI = 0.0/h). - Moderate oxygen desaturation was noted during this study (Min O2 = 86%). - Patient snored.  DIAGNOSIS - Obstructive Sleep Apnea (327.23 [G47.33 ICD-10])  RECOMMENDATIONS - Treatment for very mild OSA is directed at symptoms. If conservative measures such as weight loss and sleep off back are insufficent or not appropriate,  then CPAP, chin strap or fitted oral appliance might be considered. - Be careful with alcohol, sedatives and other CNS depressants that may worsen sleep apnea and disrupt normal sleep architecture. - Sleep hygiene should be reviewed to assess factors  that may improve sleep quality. - Weight management and regular exercise should be initiated or continued.  [Electronically signed] 02/03/2018 04:33 PM  Jetty Duhamellinton Adelaide Pfefferkorn MD, ABSM Diplomate, American Board of Sleep Medicine   NPI: [id number]                           Jetty Duhamellinton Sevin Langenbach Diplomate, American Board of Sleep Medicine  ELECTRONICALLY SIGNED ON:  02/03/2018, 4:29 PM [facility] PH: [phone]   FX: [phone] ACCREDITED BY THE AMERICAN ACADEMY OF SLEEP MEDICINE

## 2018-02-04 ENCOUNTER — Other Ambulatory Visit: Payer: Self-pay | Admitting: Family Medicine

## 2018-02-06 ENCOUNTER — Telehealth: Payer: Self-pay | Admitting: Internal Medicine

## 2018-02-06 NOTE — Telephone Encounter (Signed)
pft from 02/2017 has been faxed as requested.  Nothing further needed.

## 2018-02-12 ENCOUNTER — Other Ambulatory Visit: Payer: Self-pay | Admitting: Family Medicine

## 2018-02-12 DIAGNOSIS — E041 Nontoxic single thyroid nodule: Secondary | ICD-10-CM

## 2018-02-19 ENCOUNTER — Other Ambulatory Visit: Payer: BLUE CROSS/BLUE SHIELD

## 2018-02-27 ENCOUNTER — Other Ambulatory Visit: Payer: BLUE CROSS/BLUE SHIELD

## 2018-03-04 ENCOUNTER — Ambulatory Visit
Admission: RE | Admit: 2018-03-04 | Discharge: 2018-03-04 | Disposition: A | Discharge status: Home / Self Care | Payer: BLUE CROSS/BLUE SHIELD | Source: Ambulatory Visit | Attending: Family Medicine | Admitting: Family Medicine

## 2018-03-04 DIAGNOSIS — E041 Nontoxic single thyroid nodule: Secondary | ICD-10-CM

## 2018-09-08 ENCOUNTER — Other Ambulatory Visit: Payer: Self-pay | Admitting: Obstetrics and Gynecology

## 2018-09-08 DIAGNOSIS — Z1231 Encounter for screening mammogram for malignant neoplasm of breast: Secondary | ICD-10-CM

## 2018-09-10 DIAGNOSIS — J029 Acute pharyngitis, unspecified: Secondary | ICD-10-CM | POA: Diagnosis not present

## 2018-09-10 DIAGNOSIS — Z8709 Personal history of other diseases of the respiratory system: Secondary | ICD-10-CM | POA: Diagnosis not present

## 2018-10-01 ENCOUNTER — Other Ambulatory Visit: Payer: Self-pay | Admitting: Obstetrics and Gynecology

## 2018-10-01 DIAGNOSIS — N644 Mastodynia: Secondary | ICD-10-CM

## 2018-10-01 DIAGNOSIS — Z6821 Body mass index (BMI) 21.0-21.9, adult: Secondary | ICD-10-CM | POA: Diagnosis not present

## 2018-10-16 ENCOUNTER — Other Ambulatory Visit: Payer: Self-pay | Admitting: Family Medicine

## 2018-10-16 DIAGNOSIS — M1990 Unspecified osteoarthritis, unspecified site: Secondary | ICD-10-CM

## 2018-10-20 ENCOUNTER — Ambulatory Visit: Payer: BLUE CROSS/BLUE SHIELD

## 2018-10-20 ENCOUNTER — Ambulatory Visit
Admission: RE | Admit: 2018-10-20 | Discharge: 2018-10-20 | Disposition: A | Discharge status: Home / Self Care | Payer: BLUE CROSS/BLUE SHIELD | Source: Ambulatory Visit | Attending: Obstetrics and Gynecology | Admitting: Obstetrics and Gynecology

## 2018-10-20 DIAGNOSIS — R928 Other abnormal and inconclusive findings on diagnostic imaging of breast: Secondary | ICD-10-CM | POA: Diagnosis not present

## 2018-10-20 DIAGNOSIS — N6489 Other specified disorders of breast: Secondary | ICD-10-CM | POA: Diagnosis not present

## 2018-10-20 DIAGNOSIS — N644 Mastodynia: Secondary | ICD-10-CM

## 2018-10-28 ENCOUNTER — Other Ambulatory Visit: Payer: Self-pay | Admitting: Family Medicine

## 2018-10-28 DIAGNOSIS — R5381 Other malaise: Secondary | ICD-10-CM

## 2018-11-04 DIAGNOSIS — Z77120 Contact with and (suspected) exposure to mold (toxic): Secondary | ICD-10-CM | POA: Diagnosis not present

## 2018-11-04 DIAGNOSIS — J4530 Mild persistent asthma, uncomplicated: Secondary | ICD-10-CM | POA: Diagnosis not present

## 2018-11-04 DIAGNOSIS — Z9189 Other specified personal risk factors, not elsewhere classified: Secondary | ICD-10-CM | POA: Diagnosis not present

## 2018-11-04 DIAGNOSIS — E782 Mixed hyperlipidemia: Secondary | ICD-10-CM | POA: Diagnosis not present

## 2018-11-04 DIAGNOSIS — E063 Autoimmune thyroiditis: Secondary | ICD-10-CM | POA: Diagnosis not present

## 2018-11-04 DIAGNOSIS — E781 Pure hyperglyceridemia: Secondary | ICD-10-CM | POA: Diagnosis not present

## 2018-11-04 DIAGNOSIS — R0602 Shortness of breath: Secondary | ICD-10-CM | POA: Diagnosis not present

## 2018-11-04 DIAGNOSIS — E639 Nutritional deficiency, unspecified: Secondary | ICD-10-CM | POA: Diagnosis not present

## 2018-11-04 DIAGNOSIS — N951 Menopausal and female climacteric states: Secondary | ICD-10-CM | POA: Diagnosis not present

## 2018-11-04 DIAGNOSIS — E039 Hypothyroidism, unspecified: Secondary | ICD-10-CM | POA: Diagnosis not present

## 2018-11-04 DIAGNOSIS — E559 Vitamin D deficiency, unspecified: Secondary | ICD-10-CM | POA: Diagnosis not present

## 2018-11-04 DIAGNOSIS — Z23 Encounter for immunization: Secondary | ICD-10-CM | POA: Diagnosis not present

## 2018-11-19 ENCOUNTER — Other Ambulatory Visit: Payer: Self-pay | Admitting: Family Medicine

## 2018-11-19 DIAGNOSIS — M810 Age-related osteoporosis without current pathological fracture: Secondary | ICD-10-CM

## 2018-12-25 ENCOUNTER — Other Ambulatory Visit: Payer: BLUE CROSS/BLUE SHIELD

## 2019-04-09 DIAGNOSIS — Z6820 Body mass index (BMI) 20.0-20.9, adult: Secondary | ICD-10-CM | POA: Diagnosis not present

## 2019-04-09 DIAGNOSIS — Z124 Encounter for screening for malignant neoplasm of cervix: Secondary | ICD-10-CM | POA: Diagnosis not present

## 2019-04-09 DIAGNOSIS — Z01419 Encounter for gynecological examination (general) (routine) without abnormal findings: Secondary | ICD-10-CM | POA: Diagnosis not present

## 2019-04-30 DIAGNOSIS — U071 COVID-19: Secondary | ICD-10-CM | POA: Diagnosis not present

## 2019-05-02 DIAGNOSIS — Z20828 Contact with and (suspected) exposure to other viral communicable diseases: Secondary | ICD-10-CM | POA: Diagnosis not present

## 2019-05-02 DIAGNOSIS — R5383 Other fatigue: Secondary | ICD-10-CM | POA: Diagnosis not present

## 2019-05-02 DIAGNOSIS — R51 Headache: Secondary | ICD-10-CM | POA: Diagnosis not present

## 2019-05-12 DIAGNOSIS — R140 Abdominal distension (gaseous): Secondary | ICD-10-CM | POA: Diagnosis not present

## 2019-05-12 DIAGNOSIS — E039 Hypothyroidism, unspecified: Secondary | ICD-10-CM | POA: Diagnosis not present

## 2019-06-01 DIAGNOSIS — M545 Low back pain: Secondary | ICD-10-CM | POA: Diagnosis not present

## 2019-06-01 DIAGNOSIS — M7918 Myalgia, other site: Secondary | ICD-10-CM | POA: Diagnosis not present

## 2019-06-01 DIAGNOSIS — M9901 Segmental and somatic dysfunction of cervical region: Secondary | ICD-10-CM | POA: Diagnosis not present

## 2019-06-01 DIAGNOSIS — M542 Cervicalgia: Secondary | ICD-10-CM | POA: Diagnosis not present

## 2019-07-11 DIAGNOSIS — Z1159 Encounter for screening for other viral diseases: Secondary | ICD-10-CM | POA: Diagnosis not present

## 2019-07-11 DIAGNOSIS — Z20828 Contact with and (suspected) exposure to other viral communicable diseases: Secondary | ICD-10-CM | POA: Diagnosis not present

## 2019-07-11 DIAGNOSIS — J029 Acute pharyngitis, unspecified: Secondary | ICD-10-CM | POA: Diagnosis not present

## 2019-07-11 DIAGNOSIS — R05 Cough: Secondary | ICD-10-CM | POA: Diagnosis not present

## 2019-08-04 DIAGNOSIS — Z77120 Contact with and (suspected) exposure to mold (toxic): Secondary | ICD-10-CM | POA: Diagnosis not present

## 2019-08-04 DIAGNOSIS — R413 Other amnesia: Secondary | ICD-10-CM | POA: Diagnosis not present

## 2019-08-04 DIAGNOSIS — R5383 Other fatigue: Secondary | ICD-10-CM | POA: Diagnosis not present

## 2019-08-04 DIAGNOSIS — F419 Anxiety disorder, unspecified: Secondary | ICD-10-CM | POA: Diagnosis not present

## 2019-08-10 ENCOUNTER — Other Ambulatory Visit: Payer: Self-pay | Admitting: Obstetrics and Gynecology

## 2019-08-10 DIAGNOSIS — E2839 Other primary ovarian failure: Secondary | ICD-10-CM

## 2019-08-18 ENCOUNTER — Ambulatory Visit
Admission: RE | Admit: 2019-08-18 | Discharge: 2019-08-18 | Disposition: A | Discharge status: Home / Self Care | Payer: BC Managed Care – PPO | Source: Ambulatory Visit | Attending: Obstetrics and Gynecology | Admitting: Obstetrics and Gynecology

## 2019-08-18 ENCOUNTER — Other Ambulatory Visit: Payer: Self-pay

## 2019-08-18 DIAGNOSIS — Z780 Asymptomatic menopausal state: Secondary | ICD-10-CM | POA: Diagnosis not present

## 2019-08-18 DIAGNOSIS — M8589 Other specified disorders of bone density and structure, multiple sites: Secondary | ICD-10-CM | POA: Diagnosis not present

## 2019-08-18 DIAGNOSIS — E2839 Other primary ovarian failure: Secondary | ICD-10-CM

## 2019-08-21 ENCOUNTER — Other Ambulatory Visit (HOSPITAL_COMMUNITY): Payer: Self-pay | Admitting: Family Medicine

## 2019-08-21 ENCOUNTER — Telehealth: Payer: Self-pay | Admitting: *Deleted

## 2019-08-21 ENCOUNTER — Other Ambulatory Visit: Payer: Self-pay | Admitting: Family Medicine

## 2019-08-21 DIAGNOSIS — G319 Degenerative disease of nervous system, unspecified: Secondary | ICD-10-CM

## 2019-08-29 ENCOUNTER — Ambulatory Visit (HOSPITAL_COMMUNITY)
Admission: RE | Admit: 2019-08-29 | Discharge: 2019-08-29 | Disposition: A | Discharge status: Home / Self Care | Payer: BC Managed Care – PPO | Source: Ambulatory Visit | Attending: Family Medicine | Admitting: Family Medicine

## 2019-08-29 ENCOUNTER — Other Ambulatory Visit: Payer: Self-pay

## 2019-08-29 DIAGNOSIS — R29818 Other symptoms and signs involving the nervous system: Secondary | ICD-10-CM | POA: Diagnosis not present

## 2019-08-29 DIAGNOSIS — G319 Degenerative disease of nervous system, unspecified: Secondary | ICD-10-CM

## 2019-08-29 DIAGNOSIS — R930 Abnormal findings on diagnostic imaging of skull and head, not elsewhere classified: Secondary | ICD-10-CM | POA: Diagnosis not present

## 2019-10-19 DIAGNOSIS — Z77120 Contact with and (suspected) exposure to mold (toxic): Secondary | ICD-10-CM | POA: Diagnosis not present

## 2019-10-19 DIAGNOSIS — G3184 Mild cognitive impairment, so stated: Secondary | ICD-10-CM | POA: Diagnosis not present

## 2019-10-29 DIAGNOSIS — L918 Other hypertrophic disorders of the skin: Secondary | ICD-10-CM | POA: Diagnosis not present

## 2019-10-29 DIAGNOSIS — D225 Melanocytic nevi of trunk: Secondary | ICD-10-CM | POA: Diagnosis not present

## 2019-10-29 DIAGNOSIS — L814 Other melanin hyperpigmentation: Secondary | ICD-10-CM | POA: Diagnosis not present

## 2019-10-29 DIAGNOSIS — L905 Scar conditions and fibrosis of skin: Secondary | ICD-10-CM | POA: Diagnosis not present

## 2019-10-29 DIAGNOSIS — R208 Other disturbances of skin sensation: Secondary | ICD-10-CM | POA: Diagnosis not present

## 2019-10-29 DIAGNOSIS — D485 Neoplasm of uncertain behavior of skin: Secondary | ICD-10-CM | POA: Diagnosis not present

## 2019-11-14 DIAGNOSIS — Z23 Encounter for immunization: Secondary | ICD-10-CM | POA: Diagnosis not present

## 2019-12-03 DIAGNOSIS — H524 Presbyopia: Secondary | ICD-10-CM | POA: Diagnosis not present

## 2019-12-25 DIAGNOSIS — R600 Localized edema: Secondary | ICD-10-CM | POA: Diagnosis not present

## 2019-12-25 DIAGNOSIS — M79605 Pain in left leg: Secondary | ICD-10-CM | POA: Diagnosis not present

## 2020-01-14 ENCOUNTER — Telehealth: Payer: Self-pay | Admitting: Internal Medicine

## 2020-01-14 DIAGNOSIS — M79605 Pain in left leg: Secondary | ICD-10-CM | POA: Diagnosis not present

## 2020-01-14 DIAGNOSIS — R600 Localized edema: Secondary | ICD-10-CM | POA: Diagnosis not present

## 2020-01-14 NOTE — Telephone Encounter (Signed)
Called and spoke with Patient.  Patient stated she has been exposed to mold for past 15 years, in her house.  Patient stated she is in the process of having air ducts cleaned and replaced. Patient stated she stayed with her Daughter last night, but her Daughter thinks she may have mold also.   Patient stated at 0100, 01/14/20, she was woke by her fitness watch buzzing, because her O2 sats were 82%.  Patient stated she is very active and runs.  Patient stated she had never had that problem before, and was concerned it is due to mold exposure.   Patient is scheduled with Dr. Katrinka Blazing, 01/18/20.  There are no openings for any providers, before her scheduled consult. Patient asked if this could be a danger to her body, with her sats dropping.  Advised Patient to consult PCP, if needed, and sent emergency care if her sats continue to drop.  Understanding stated.   Patient stated she has labs showing she has mold exposures.  Advised Patient to have records faxed or bring with her to [facility].  Patient stated she has copies of labs that she will bring to consult.

## 2020-01-16 NOTE — Progress Notes (Signed)
Synopsis: nocturnal hypoxemia  Assessment & Plan:  Problem 1 Nocturnal hypoxemia: in setting of visiting kid's house with known mold.  Self-resolved.  Suspicion for sinus/pharyngeal swelling caused by this exposure leading to ineffectiveness of dental appliance in controlling OSA. - She will re-expose herself to kid's house and remeasure O2 at night, if clear correlation, we will Rx symbicort and singulair to use PRN when she visits her kid's home.  Problem 2 OSA- mild, controlled with dental appliance, wants to know if there is a local dentist to help manage  - gave number to local dentist who specializes in OSA   Problem 3 Mild intermittent asthma- no effect on ability to exercise at this time, she will monitor clinically and let us know if she wants further Rx   MDM . I reviewed prior external note(s) from Dr. Renaldo Reel on 11/04/2018 . I reviewed the result(s) of PSG 01/24/18 showing AHI 17, full PFTs 11/04/18 benign . I have ordered expectant management  A total of 50 minutes of time spent with patient greater than 50% was spent face to face  End of visit medications:  Current Outpatient Medications:  .  Calcium Carbonate-Vit D-Min (CALCIUM 1200 PO), Take 1 tablet by mouth., Disp: , Rfl:  .  conjugated estrogens (PREMARIN) vaginal cream, Place 1 Applicatorful vaginally daily., Disp: , Rfl:  .  FISH OIL-COENZYME Q10 PO, Take by mouth., Disp: , Rfl:  .  ibuprofen (ADVIL,MOTRIN) 200 MG tablet, Take 200 mg by mouth every 6 (six) hours as needed., Disp: , Rfl:  .  ondansetron (ZOFRAN) 4 MG tablet, Take 1 tablet (4 mg total) by mouth every 8 (eight) hours as needed for nausea or vomiting. (Patient not taking: Reported on 12/25/2016), Disp: 40 tablet, Rfl: 0 .  oxyCODONE-acetaminophen (ROXICET) 5-325 MG per tablet, Take 1-2 tablets by mouth every 4 (four) hours as needed. (Patient not taking: Reported on 12/25/2016), Disp: 30 tablet, Rfl: 0 .  progesterone (PROMETRIUM) 100 MG capsule, Take 100 mg  by mouth daily., Disp: , Rfl:  .  SYNTHROID 25 MCG tablet, Take 1 tablet by mouth daily., Disp: , Rfl:  .  vitamin C (ASCORBIC ACID) 500 MG tablet, Take 500 mg by mouth daily.  , Disp: , Rfl:  .  VITAMIN D, CHOLECALCIFEROL, PO, Take 1 tablet by mouth daily.  , Disp: , Rfl:    Lorin Glass, MD [facility] 01/16/2020 11:54 AM    Subjective:   PATIENT ID: Michelle Nolan GENDER: female DOB: 1956/01/20, MRN: [id number]  No chief complaint on file.   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, very active   MEDICATION HISTORY (additional elements, see above):  COPD/ASTHMA COMORBIDITIES Gastroesophageal Reflux Disease (GERD): n Post Nasal Drip Syndrome (PNDS): clears throat  Obstructive Sleep Apnea (OSA): ahi 8/hr   PAST RESPIRATORY HISTORY Ever diagnosed with COPD: n Ever hospitalized for respiratory infection: n Ever intubated:n  ASTHMA HISTORY Ever diagnosed with asthma: yes 2012 Childhood Asthma diagnosis: n Respiratory issues as a  child (ie, frequent infections, trouble keeping up with others):n Allergies:  - seasonal: n  - evironmental: n - Allergen control techniques: Allergy skin testing: n Allergy shots:n "  Recommended for TH2 labs at that time and/or PRN LABA/ICS, refused both  Has a history of sleep apnea, mild, controlled well with dental appliance.  Last week noticed that she kept  getting awakened from sleep with her home oximetry monitor.  This was after she developed allergic-type symptoms at her child's home, which has a known mold problem.  The next night she had a similar issue but less severe and since has had no issues.  She is wondering if she needs CPAP to use on a PRN basis if this recurs again.  Ancillary information including prior medications, full medical/surgical/family/social histoies, and PFTs (when available) are listed below and have been reviewed.   ROS + symptoms in bold Fevers, chills, weight loss Nausea, vomiting, diarrhea Shortness of breath, wheezing, cough Chest pain, palpitations, lower ext edema   Objective:  There were no vitals filed for this visit.   on RA BMI Readings from Last 3 Encounters:  06/18/17 20.80 kg/m  12/25/16 21.43 kg/m  03/17/15 21.80 kg/m   Wt Readings from Last 3 Encounters:  06/18/17 125 lb (56.7 kg)  12/25/16 128 lb 12.8 oz (58.4 kg)  03/17/15 131 lb (59.4 kg)    GEN: 64 year old woman in no acute distress, appears younger than stated age 50: trachea midline, mucus membranes moist, malampatti 1 CV: Regular rate and rhythm, extremities are warm PULM: Clear without wheezing GI: Soft, +BS EXT: No edema NEURO: Moves all 4 extremities, ambulated normally   Ancillary Information    Past Medical History:  Diagnosis Date  . Chondromalacia of right patella   . Hypothyroidism      Family History  Problem Relation Age of Onset  . COPD Father   . Breast cancer Mother      Past Surgical History:  Procedure Laterality Date  . AUGMENTATION MAMMAPLASTY Bilateral   . BREAST ENHANCEMENT SURGERY Bilateral   . CESAREAN SECTION     x2  . KNEE ARTHROSCOPY Left   . KNEE ARTHROSCOPY WITH LATERAL MENISECTOMY Right 03/17/2015   Procedure: KNEE ARTHROSCOPY WITH PARTIAL LATERAL MENISECTOMY;  Surgeon: Kathryne Hitch, MD;  Location: [place];  Service: Orthopedics;  Laterality: Right;  . KNEE  ARTHROSCOPY WITH MEDIAL MENISECTOMY Right 03/17/2015   Procedure: RIGHT KNEE ARTHROSCOPY CHONDROPLASTY WITH MEDIAL MENISCECTOMY;  Surgeon: Kathryne Hitch, MD;  Location: [place] [facility];  Service: Orthopedics;  Laterality: Right;  . WRIST SURGERY Left     Social History   Socioeconomic History  . Marital status: Married    Spouse name: Not on file  . Number of children: Not on file  . Years of education: Not on file  . Highest education level: Not on file  Occupational History  . Not on file  Tobacco Use  . Smoking status: Former Smoker    Packs/day: 0.30    Years: 5.00    Pack years: 1.50    Quit date: 10/15/1978    Years since quitting: 41.2  . Smokeless tobacco: Never Used  Substance and Sexual Activity  . Alcohol use: Yes    Comment: social  . Drug use: No  . Sexual activity: Yes  Other Topics Concern  . Not on file  Social History Narrative  . Not on file   Social Determinants of Health   Financial Resource Strain:   .  Difficulty of Paying Living Expenses:   Food Insecurity:   . Worried About Programme researcher, broadcasting/film/video in the Last Year:   . Barista in the Last Year:   Transportation Needs:   . Freight forwarder (Medical):   Marland Kitchen Lack of Transportation (Non-Medical):   Physical Activity:   . Days of Exercise per Week:   . Minutes of Exercise per Session:   Stress:   . Feeling of Stress :   Social Connections:   . Frequency of Communication with Friends and Family:   . Frequency of Social Gatherings with Friends and Family:   . Attends Religious Services:   . Active Member of Clubs or Organizations:   . Attends Banker Meetings:   Marland Kitchen Marital Status:   Intimate Partner Violence:   . Fear of Current or Ex-Partner:   . Emotionally Abused:   Marland Kitchen Physically Abused:   . Sexually Abused:      Allergies  Allergen Reactions  . Codeine Nausea Only  . Sulfonamide Derivatives     REACTION: rash     CBC    Component Value Date/Time   HGB  13.9 03/17/2015 0650    Pulmonary Functions Testing Results: PFT Results Latest Ref Rng & Units 02/14/2017  FVC-Pre L 3.33  FVC-Predicted Pre % 98  FVC-Post L 3.03  FVC-Predicted Post % 89  Pre FEV1/FVC % % 72  Post FEV1/FCV % % 82  FEV1-Pre L 2.39  FEV1-Predicted Pre % 91  FEV1-Post L 2.48  DLCO UNC% % 74  DLCO COR %Predicted % 75  TLC L 5.24  TLC % Predicted % 100  RV % Predicted % 113    Outpatient Medications Prior to Visit  Medication Sig Dispense Refill  . Calcium Carbonate-Vit D-Min (CALCIUM 1200 PO) Take 1 tablet by mouth.    . conjugated estrogens (PREMARIN) vaginal cream Place 1 Applicatorful vaginally daily.    Marland Kitchen FISH OIL-COENZYME Q10 PO Take by mouth.    Marland Kitchen ibuprofen (ADVIL,MOTRIN) 200 MG tablet Take 200 mg by mouth every 6 (six) hours as needed.    . ondansetron (ZOFRAN) 4 MG tablet Take 1 tablet (4 mg total) by mouth every 8 (eight) hours as needed for nausea or vomiting. (Patient not taking: Reported on 12/25/2016) 40 tablet 0  . oxyCODONE-acetaminophen (ROXICET) 5-325 MG per tablet Take 1-2 tablets by mouth every 4 (four) hours as needed. (Patient not taking: Reported on 12/25/2016) 30 tablet 0  . progesterone (PROMETRIUM) 100 MG capsule Take 100 mg by mouth daily.    Marland Kitchen SYNTHROID 25 MCG tablet Take 1 tablet by mouth daily.    . vitamin C (ASCORBIC ACID) 500 MG tablet Take 500 mg by mouth daily.      Marland Kitchen VITAMIN D, CHOLECALCIFEROL, PO Take 1 tablet by mouth daily.       No facility-administered medications prior to visit.

## 2020-01-18 ENCOUNTER — Other Ambulatory Visit: Payer: Self-pay

## 2020-01-18 ENCOUNTER — Ambulatory Visit (INDEPENDENT_AMBULATORY_CARE_PROVIDER_SITE_OTHER): Payer: BC Managed Care – PPO | Admitting: Internal Medicine

## 2020-01-18 ENCOUNTER — Encounter: Payer: Self-pay | Admitting: Internal Medicine

## 2020-01-18 VITALS — BP 108/64 | HR 70 | Temp 98.4°F | Ht 64.0 in | Wt 124.8 lb

## 2020-01-18 DIAGNOSIS — G4733 Obstructive sleep apnea (adult) (pediatric): Secondary | ICD-10-CM | POA: Diagnosis not present

## 2020-01-18 NOTE — Patient Instructions (Addendum)
-   Visit your kids again and see if the nocturnal desaturations happen again - If they happen again, call us and we will prescribe symbicort to use when visiting your kids - Irene Limbo DDS may be a good option to continue to follow your sleep apnea dental appliance; you may not need much followup with this however  - Televisit in 6 weeks to see how you are doing, if improved, can be as needed

## 2020-01-21 ENCOUNTER — Telehealth: Payer: Self-pay | Admitting: Internal Medicine

## 2020-01-21 NOTE — Telephone Encounter (Signed)
ATC Patient.  LM to call back. 

## 2020-01-22 NOTE — Telephone Encounter (Signed)
Pt returning missed call. Best time to call is now-9:45am or from 1pm-2pm today. Pt gives verbal consent to leave a detailed messaged on answering machine.

## 2020-01-22 NOTE — Telephone Encounter (Signed)
The times that the patient states we can call her is not in the time window now. There is not a detailed message to leave as we were calling her to get more information in regards to her snoring. I left a voicemail explaining to her the reason for our call.

## 2020-01-22 NOTE — Telephone Encounter (Signed)
LMTCB x2 for pt 

## 2020-01-25 ENCOUNTER — Telehealth: Payer: Self-pay | Admitting: Internal Medicine

## 2020-01-25 NOTE — Telephone Encounter (Signed)
Called and spoke with pt stating to her that we could move her up to a sooner appt with Arlys John tomorrow 4/13 for a televisit so he can further discuss the CPAP with her to see if she wants to go on that to help with the OSA instead of the oral device. Pt stated that was fine. appt was scheduled. Nothing further needed.

## 2020-01-26 ENCOUNTER — Encounter: Payer: Self-pay | Admitting: Pulmonary Disease

## 2020-01-26 ENCOUNTER — Other Ambulatory Visit: Payer: Self-pay

## 2020-01-26 ENCOUNTER — Ambulatory Visit (INDEPENDENT_AMBULATORY_CARE_PROVIDER_SITE_OTHER): Payer: BC Managed Care – PPO | Admitting: Pulmonary Disease

## 2020-01-26 DIAGNOSIS — J4520 Mild intermittent asthma, uncomplicated: Secondary | ICD-10-CM | POA: Diagnosis not present

## 2020-01-26 DIAGNOSIS — G4733 Obstructive sleep apnea (adult) (pediatric): Secondary | ICD-10-CM

## 2020-01-26 NOTE — Assessment & Plan Note (Addendum)
Currently using oral appliance Patient has concerns its not adequately treating sleep apnea Patient would like to be placed on a CPAP  Plan: CPAP start APAP 5-15 After confirm CPAP compliance can do overnight oximetry on CPAP to monitor for nighttime oxygen levels Establish care with Dr. Wynona Neat in our office

## 2020-01-26 NOTE — Assessment & Plan Note (Signed)
Plan:  Continue to clinically monitor   

## 2020-01-26 NOTE — Progress Notes (Signed)
Virtual Visit via Telephone Note  I connected with Michelle Nolan on 01/26/20 at  1:30 PM EDT by telephone and verified that I am speaking with the correct person using two identifiers.  Location: Patient: Home Provider: Office [facility] - [address] [place], [address], [place], [place] [address]   I discussed the limitations, risks, security and privacy concerns of performing an evaluation and management service by telephone and the availability of in person appointments. I also discussed with the patient that there may be a patient responsible charge related to this service. The patient expressed understanding and agreed to proceed.  Patient consented to consult via telephone: Yes People present and their role in pt care: Pt     History of Present Illness:  64 year old female former smoker followed in our office for asthma and OSA   Past medical history: Hypothyroidism, GERD Smoking history: Former smoker.  Quit 1980.  1.5 pack years Maintenance: None  Patient of: Dr. Katrinka Blazing  Chief complaint: Discuss OSA treatment options   64 year old female former smoker followed in our office for asthma.  She was last seen in our office for consult on 01/18/2020.  Consult on 01/18/2020 with Dr. Katrinka Blazing was regarding patient's episode of nocturnal hypoxemia which appears to be related to visiting a family member's house with known mold.  This had self resolved.  It was felt at that time this may be also directly related to poor obstructive sleep apnea control with patient's oral appliance.  Patient wanted information on a local dentist in the area who could potentially help with an improved oral appliance.  06/18/2017 - HST - AHI 17.4 01/24/2018 - HST - 8.1  Patient completing virtual visit with our office today.  She reports that she is concerned that despite using her oral appliance she is having increased snoring, morning headaches, she also has concerns that her oxygen levels are dropping given  her recent events when she stayed at her family members house and her oxygen levels were in the high 80s.  She is interested in stopping oral appliance use and starting CPAP therapy.  Observations/Objective:  06/18/2017 - HST - AHI 17.4 01/24/2018 - HST - 8.1  Social History   Tobacco Use  Smoking Status Former Smoker  . Packs/day: 0.30  . Years: 5.00  . Pack years: 1.50  . Quit date: 10/15/1978  . Years since quitting: 41.3  Smokeless Tobacco Never Used   Immunization History  Administered Date(s) Administered  . Pneumococcal Conjugate-13 11/04/2018      Assessment and Plan:  Asthma Plan: Continue to clinically monitor  OSA (obstructive sleep apnea) Currently using oral appliance Patient has concerns its not adequately treating sleep apnea Patient would like to be placed on a CPAP  Plan: CPAP start APAP 5-15 After confirm CPAP compliance can do overnight oximetry on CPAP to monitor for nighttime oxygen levels Establish care with Dr. Wynona Neat in our office    Follow Up Instructions:  Return in about 2 months (around 03/27/2020), or if symptoms worsen or fail to improve, for Follow up with Dr. Wynona Neat - slot .   I discussed the assessment and treatment plan with the patient. The patient was provided an opportunity to ask questions and all were answered. The patient agreed with the plan and demonstrated an understanding of the instructions.   The patient was advised to call back or seek an in-person evaluation if the symptoms worsen or if the condition fails to improve as anticipated.  I provided 23 minutes of non-face-to-face time during this encounter.   Michelle Rinne, NP

## 2020-01-26 NOTE — Patient Instructions (Addendum)
You were seen today by Coral Ceo, NP  for:   1. OSA (obstructive sleep apnea)  New CPAP start DME of choice APAP setting 5-15 Mask of choice  Patient will need to establish with Dr. Wynona Neat in slot in 8 weeks to show cpap compliance   Can consider overnight oximetry test on CPAP once CPAP compliance is confirmed  Follow Up:    Return in about 2 months (around 03/27/2020), or if symptoms worsen or fail to improve, for Follow up with Dr. Wynona Neat - slot .   Please do your part to reduce the spread of COVID-19:      Reduce your risk of any infection  and COVID19 by using the similar precautions used for avoiding the common cold or flu:  Marland Kitchen Wash your hands often with soap and warm water for at least 20 seconds.  If soap and water are not readily available, use an alcohol-based hand sanitizer with at least 60% alcohol.  . If coughing or sneezing, cover your mouth and nose by coughing or sneezing into the elbow areas of your shirt or coat, into a tissue or into your sleeve (not your hands). Drinda Butts A MASK when in public  . Avoid shaking hands with others and consider head nods or verbal greetings only. . Avoid touching your eyes, nose, or mouth with unwashed hands.  . Avoid close contact with people who are sick. . Avoid places or events with large numbers of people in one location, like concerts or sporting events. . If you have some symptoms but not all symptoms, continue to monitor at home and seek medical attention if your symptoms worsen. . If you are having a medical emergency, call 911.   ADDITIONAL HEALTHCARE OPTIONS FOR PATIENTS  [facility] / e-Visit: [email or web address]         [facility]: [phone]  [facility]: [phone]                   [facility]: [phone]     It is flu season:   >>> Best ways to protect herself from the flu: Receive the yearly  flu vaccine, practice good hand hygiene washing with soap and also using hand sanitizer when available, eat a nutritious meals, get adequate rest, hydrate appropriately   Please contact the office if your symptoms worsen or you have concerns that you are not improving.   Thank you for choosing [facility] for your healthcare, and for allowing [place] to partner with you on your healthcare journey. I am thankful to be able to provide care to you today.   Elisha Headland FNP-C

## 2020-02-01 NOTE — Addendum Note (Signed)
Addended by: Maurene Capes on: 02/01/2020 11:50 AM   Modules accepted: Orders

## 2020-02-11 ENCOUNTER — Telehealth: Payer: Self-pay | Admitting: Pulmonary Disease

## 2020-02-11 DIAGNOSIS — G4733 Obstructive sleep apnea (adult) (pediatric): Secondary | ICD-10-CM

## 2020-02-11 NOTE — Telephone Encounter (Signed)
LMTCB x1 for pt.  

## 2020-02-12 NOTE — Telephone Encounter (Signed)
Alright, we can adjust pressure 4-14cm h20

## 2020-02-12 NOTE — Telephone Encounter (Signed)
Spoke with patient. She is aware that Michelle Nolan has approved the cpap pressure change. Will go ahead and place the order to [facility].   Nothing further needed at time of call.

## 2020-02-12 NOTE — Telephone Encounter (Signed)
Can you check download for me- what is her AHI and average pressure 95%

## 2020-02-12 NOTE — Telephone Encounter (Signed)
Spoke with pt. States that she is wanting her CPAP pressure to be lowered to 4-15cm from 5-15cm. Feels like 5cm is way too strong for her. [facility] advised her that they can lower the pressure for her but they would need an order. Pt is wanting this taken care of today because she hasn't been able to use the machine for the past few nights and doesn't want to go the entire weekend without it.  [name] - please advise as Michelle Nolan is not here today. Thanks.

## 2020-02-12 NOTE — Telephone Encounter (Signed)
Patient is not in [facility] or Furniture conservator/restorer. Called [facility] and spoke with [place]. She stated that the patient was setup yesterday with her CPAP. It will take 48 hours before the information will appear in [facility].

## 2020-02-16 ENCOUNTER — Telehealth: Payer: Self-pay | Admitting: Pulmonary Disease

## 2020-02-16 NOTE — Telephone Encounter (Signed)
Called and spoke with Patient.  Patient stated she has been trying cpap for mild osa. Patient stated she is having gas, burping, wakes with headache.  Patient stated she feels the pressure is to much, because she is having the pressure push air bubbles out of her mouth. Patient stated she feels the cpap may be to strong for her osa.  Patient stated she uses a oral device, and feels her apnea may not be airway, but brain related. Patient stated she feels her brain does not cue her to breathe. Patient stated she may have central sleep apnea instead of obstructive sleep apnea. Patient was referred in [place], NP's last note to follow up with Dr.Olalere for sleep.  Patient questioned if she should return her cpap to [facility],  have a in lab sleep test, or try other options. Patient is scheduled 02/18/20, consult with Dr. Wynona Neat at 4:15.

## 2020-02-18 ENCOUNTER — Other Ambulatory Visit: Payer: Self-pay

## 2020-02-18 ENCOUNTER — Ambulatory Visit (INDEPENDENT_AMBULATORY_CARE_PROVIDER_SITE_OTHER): Payer: BC Managed Care – PPO | Admitting: Pulmonary Disease

## 2020-02-18 ENCOUNTER — Encounter: Payer: Self-pay | Admitting: Pulmonary Disease

## 2020-02-18 VITALS — BP 102/62 | HR 93 | Temp 97.7°F | Ht 65.0 in | Wt 126.0 lb

## 2020-02-18 DIAGNOSIS — G4733 Obstructive sleep apnea (adult) (pediatric): Secondary | ICD-10-CM

## 2020-02-18 NOTE — Patient Instructions (Signed)
Nonrestorative sleep History of mild sleep apnea, intolerant of CPAP Oral device is helping  Overnight oximetry on room air with oral device in place  Follow-up in 3 months

## 2020-02-18 NOTE — Progress Notes (Addendum)
Michelle Nolan    [id number]    [birth year]  Primary Care Physician:Callahan, Luther Parody, DO  Referring Physician: Philip Aspen, DO [address] [place],  [place] [address]  Chief complaint:   Nonrestorative sleep  HPI:  Patient with a history of nonrestorative sleep 2 sleep studies in the past with obstructive sleep apnea  She was diagnosed with mild obstructive sleep apnea-did not tolerate CPAP, was left with bloating and was not able to use CPAP An oral device was fashioned, this does help symptoms  She wakes up feeling unrestored Usually after waking up she will exercise and then start feeling better  She had a sleep study done in [place] with the oral device in place for after night and for the other half of the night she did not have the oral device in place and did not have any evidence of sleep disordered breathing by her report  She does have arranged that she uses to measure her oxygen levels  She is concerned that even while awake her oxygen levels drop She is able to function well, exercises regularly and is not limited with activities  She stated that her spouse did record her once, monitoring respiratory rate at night which was only about 5-6  She has some concern for central sleep apnea  -I did reassure her that if her last study did not reveal any evidence of apneas at all, then this is unlikely as an in lab study would have picked up central and obstructive sleep apnea   Outpatient Encounter Medications as of 02/18/2020  Medication Sig  . Calcium Carbonate-Vit D-Min (CALCIUM 1200 PO) Take 1 tablet by mouth.  Marland Kitchen FISH OIL-COENZYME Q10 PO Take by mouth.  . progesterone (PROMETRIUM) 100 MG capsule Take 100 mg by mouth daily.  . vitamin C (ASCORBIC ACID) 500 MG tablet Take 500 mg by mouth daily.    Marland Kitchen VITAMIN D, CHOLECALCIFEROL, PO Take 1 tablet by mouth daily.    . [DISCONTINUED] conjugated estrogens (PREMARIN) vaginal cream Place 1  Applicatorful vaginally daily.  . [DISCONTINUED] ibuprofen (ADVIL,MOTRIN) 200 MG tablet Take 200 mg by mouth every 6 (six) hours as needed.   No facility-administered encounter medications on file as of 02/18/2020.    Allergies as of 02/18/2020 - Review Complete 02/18/2020  Allergen Reaction Noted  . Codeine Nausea Only 03/11/2015  . Sulfonamide derivatives      Past Medical History:  Diagnosis Date  . Chondromalacia of right patella   . Hypothyroidism     Past Surgical History:  Procedure Laterality Date  . AUGMENTATION MAMMAPLASTY Bilateral   . BREAST ENHANCEMENT SURGERY Bilateral   . CESAREAN SECTION     x2  . KNEE ARTHROSCOPY Left   . KNEE ARTHROSCOPY WITH LATERAL MENISECTOMY Right 03/17/2015   Procedure: KNEE ARTHROSCOPY WITH PARTIAL LATERAL MENISECTOMY;  Surgeon: Mckinley Jewel, MD;  Location: [facility];  Service: Orthopedics;  Laterality: Right;  . KNEE ARTHROSCOPY WITH MEDIAL MENISECTOMY Right 03/17/2015   Procedure: RIGHT KNEE ARTHROSCOPY CHONDROPLASTY WITH MEDIAL MENISCECTOMY;  Surgeon: Mckinley Jewel, MD;  Location: [facility];  Service: Orthopedics;  Laterality: Right;  . WRIST SURGERY Left     Family History  Problem Relation Age of Onset  . COPD Father   . Breast cancer Mother     Social History   Socioeconomic History  . Marital status: Married    Spouse name: Not on file  . Number  of children: Not on file  . Years of education: Not on file  . Highest education level: Not on file  Occupational History  . Not on file  Tobacco Use  . Smoking status: Former Smoker    Packs/day: 0.30    Years: 5.00    Pack years: 1.50    Types: Cigarettes    Quit date: 10/15/1978    Years since quitting: 41.3  . Smokeless tobacco: Never Used  Substance and Sexual Activity  . Alcohol use: Yes    Comment: social  . Drug use: No  . Sexual activity: Yes  Other Topics Concern  . Not on file  Social History Narrative  . Not on file   Social  Determinants of Health   Financial Resource Strain:   . Difficulty of Paying Living Expenses:   Food Insecurity:   . Worried About Charity fundraiser in the Last Year:   . Arboriculturist in the Last Year:   Transportation Needs:   . Film/video editor (Medical):   Marland Kitchen Lack of Transportation (Non-Medical):   Physical Activity:   . Days of Exercise per Week:   . Minutes of Exercise per Session:   Stress:   . Feeling of Stress :   Social Connections:   . Frequency of Communication with Friends and Family:   . Frequency of Social Gatherings with Friends and Family:   . Attends Religious Services:   . Active Member of Clubs or Organizations:   . Attends Archivist Meetings:   Marland Kitchen Marital Status:   Intimate Partner Violence:   . Fear of Current or Ex-Partner:   . Emotionally Abused:   Marland Kitchen Physically Abused:   . Sexually Abused:     Review of Systems  All other systems reviewed and are negative.   Vitals:   02/18/20 1625  BP: 102/62  Pulse: 93  Temp: 97.7 F (36.5 C)  SpO2: 94%    Physical Exam  Constitutional: She appears well-developed and well-nourished.  HENT:  Head: Normocephalic and atraumatic.  Eyes: Pupils are equal, round, and reactive to light. Right eye exhibits no discharge. Left eye exhibits no discharge.  Neck: No tracheal deviation present. No thyromegaly present.  Cardiovascular: Normal rate and regular rhythm.  Pulmonary/Chest: Effort normal and breath sounds normal. No respiratory distress. She has no wheezes. She has no rales. She exhibits no tenderness.  Musculoskeletal:     Cervical back: Normal range of motion and neck supple.   Data Reviewed: Sleep study from 2019 reviewed Sleep study from 2018 reviewed showing moderate obstructive sleep apnea Sleep study from 2019 revealed mild obstructive sleep apnea  She did have the study performed in [place] in 2019-in lab study that did not reveal obstructive sleep apnea  Assessment:  Past  history of mild obstructive sleep apnea -Only able to tolerate an oral device which seems to be helping  Concern about nonrestorative sleep Concern about desaturations at night  Plan/Recommendations: We will obtain an overnight oximetry  Her concern for central sleep apnea can only be resolved by obtaining an in lab polysomnogram  Continue lines of care  Oximetry should be performed with oral device in place  Follow-up in 3 months  Sherrilyn Rist MD [facility] 02/18/2020, 4:58 PM  CC: Allyn Kenner, DO

## 2020-03-02 DIAGNOSIS — I8311 Varicose veins of right lower extremity with inflammation: Secondary | ICD-10-CM | POA: Diagnosis not present

## 2020-03-04 ENCOUNTER — Telehealth: Payer: Self-pay | Admitting: Pulmonary Disease

## 2020-03-04 ENCOUNTER — Ambulatory Visit: Payer: BC Managed Care – PPO | Admitting: Pulmonary Disease

## 2020-03-04 DIAGNOSIS — I87323 Chronic venous hypertension (idiopathic) with inflammation of bilateral lower extremity: Secondary | ICD-10-CM | POA: Diagnosis not present

## 2020-03-04 NOTE — Telephone Encounter (Signed)
Letter from [facility] received, patient refusing to use ONO, order voided in [place], per Standley Dakins, Sales at [facility].

## 2020-03-07 DIAGNOSIS — J3089 Other allergic rhinitis: Secondary | ICD-10-CM | POA: Diagnosis not present

## 2020-03-07 DIAGNOSIS — R062 Wheezing: Secondary | ICD-10-CM | POA: Diagnosis not present

## 2020-03-15 DIAGNOSIS — B2709 Gammaherpesviral mononucleosis with other complications: Secondary | ICD-10-CM | POA: Diagnosis not present

## 2020-03-15 DIAGNOSIS — Z77120 Contact with and (suspected) exposure to mold (toxic): Secondary | ICD-10-CM | POA: Diagnosis not present

## 2020-03-15 DIAGNOSIS — G3184 Mild cognitive impairment, so stated: Secondary | ICD-10-CM | POA: Diagnosis not present

## 2020-03-21 DIAGNOSIS — M25562 Pain in left knee: Secondary | ICD-10-CM | POA: Diagnosis not present

## 2020-03-25 DIAGNOSIS — M25562 Pain in left knee: Secondary | ICD-10-CM | POA: Diagnosis not present

## 2020-04-26 DIAGNOSIS — Z09 Encounter for follow-up examination after completed treatment for conditions other than malignant neoplasm: Secondary | ICD-10-CM | POA: Diagnosis not present

## 2020-04-26 DIAGNOSIS — Z01419 Encounter for gynecological examination (general) (routine) without abnormal findings: Secondary | ICD-10-CM | POA: Diagnosis not present

## 2020-04-26 DIAGNOSIS — Z8742 Personal history of other diseases of the female genital tract: Secondary | ICD-10-CM | POA: Diagnosis not present

## 2020-04-26 DIAGNOSIS — Z6820 Body mass index (BMI) 20.0-20.9, adult: Secondary | ICD-10-CM | POA: Diagnosis not present

## 2020-04-26 DIAGNOSIS — Z124 Encounter for screening for malignant neoplasm of cervix: Secondary | ICD-10-CM | POA: Diagnosis not present

## 2020-05-10 ENCOUNTER — Telehealth: Payer: Self-pay

## 2020-05-10 NOTE — Telephone Encounter (Signed)
Called pt to schedule an appointment with Dr. Cristal Deer on 8/11 or 8/13. Left message to call back.

## 2020-05-10 NOTE — Telephone Encounter (Signed)
Appointment scheduled for 8/13 with Dr. Cristal Deer.

## 2020-05-27 ENCOUNTER — Ambulatory Visit: Payer: BC Managed Care – PPO | Admitting: Cardiology

## 2020-06-17 ENCOUNTER — Ambulatory Visit (INDEPENDENT_AMBULATORY_CARE_PROVIDER_SITE_OTHER): Payer: BC Managed Care – PPO | Admitting: Cardiology

## 2020-06-17 ENCOUNTER — Encounter: Payer: Self-pay | Admitting: Cardiology

## 2020-06-17 ENCOUNTER — Other Ambulatory Visit: Payer: Self-pay

## 2020-06-17 VITALS — BP 110/70 | HR 67 | Temp 97.3°F | Ht 65.0 in | Wt 125.0 lb

## 2020-06-17 DIAGNOSIS — Z7189 Other specified counseling: Secondary | ICD-10-CM

## 2020-06-17 DIAGNOSIS — Z01812 Encounter for preprocedural laboratory examination: Secondary | ICD-10-CM | POA: Diagnosis not present

## 2020-06-17 DIAGNOSIS — R072 Precordial pain: Secondary | ICD-10-CM

## 2020-06-17 NOTE — Progress Notes (Signed)
Cardiology Office Note:    Date:  06/17/2020   ID:  Michelle Nolan, Michelle Nolan Nov 03, 1955, MRN [id number]  PCP:  Allyn Kenner, DO  Cardiologist:  Buford Dresser, MD  Referring MD: Allyn Kenner, DO   CC: new patient consultation for chest discomfort  History of Present Illness:    Michelle Nolan is a 64 y.o. female with a hx of asthma, anxiety who is seen as a new consult at the request of Allyn Kenner, DO for the evaluation and management of chest discomfort. She is a friend of Dr. Addison Lank.  Wants to make sure her heart is ok. A few months ago, started noting burning pain in her chest, going down both arms while walking. Has been intermittent, but sometimes notices at night as well--can wake her up from sleep.  Cardiovascular risk factors: Prior clinical ASCVD: none Comorbid conditions: Denies hypertension, hyperlipidemia, diabetes, chronic kidney disease Metabolic syndrome/Obesity: BMI 20 Chronic inflammatory conditions: did an inflammation panel several years ago, was elevated but now improving. Follows with a Medical illustrator in [place], being followed for mold, has been evaluated for mold, lyme, etc. Noted to be sensitive to oxalate, has been trying to follow low oxalate diet, wonders if that can affect her heart. Tobacco use history: former, age [age]ish Family history: mother and oldest sister both died of Covid last year. Father had a stroke in his mid-41s. No other history of MI/CVA or heart disease. 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  Past Medical History:  Diagnosis Date  . Chondromalacia of right patella   . Hypothyroidism     Past Surgical History:  Procedure Laterality Date  . AUGMENTATION MAMMAPLASTY Bilateral     . BREAST ENHANCEMENT SURGERY Bilateral   . CESAREAN SECTION     x2  . KNEE ARTHROSCOPY Left   . KNEE ARTHROSCOPY WITH LATERAL MENISECTOMY Right 03/17/2015   Procedure: KNEE ARTHROSCOPY WITH PARTIAL LATERAL MENISECTOMY;  Surgeon: Kathryne Hitch, MD;  Location: [facility];  Service: Orthopedics;  Laterality: Right;  . KNEE ARTHROSCOPY WITH MEDIAL MENISECTOMY Right 03/17/2015   Procedure: RIGHT KNEE ARTHROSCOPY CHONDROPLASTY WITH MEDIAL MENISCECTOMY;  Surgeon: Kathryne Hitch, MD;  Location: [facility];  Service: Orthopedics;  Laterality: Right;  . WRIST SURGERY Left     Current Medications: Current Outpatient Medications on File Prior to Visit  Medication Sig  . Calcium Carbonate-Vit D-Min (CALCIUM 1200 PO) Take 1 tablet by mouth.  . escitalopram (LEXAPRO) 10 MG tablet escitalopram 10 mg tablet  Take 1 tablet every day by oral route.  Marland Kitchen FISH OIL-COENZYME Q10 PO Take by mouth.  . progesterone (PROMETRIUM) 100 MG capsule Take 100 mg by mouth daily.  Marland Kitchen VITAMIN D, CHOLECALCIFEROL, PO Take 1 tablet by mouth daily.     No current facility-administered medications on file prior to visit.     Allergies:   Codeine and Sulfonamide derivatives   Social History   Tobacco Use  . Smoking status: Former Smoker    Packs/day: 0.30    Years: 5.00    Pack years: 1.50    Types: Cigarettes    Quit date: 10/15/1978    Years since quitting: 41.7  . Smokeless tobacco: Never Used  Substance Use Topics  . Alcohol use: Yes    Comment: social  .  Drug use: No    Family History: family history includes Breast cancer in her mother; COPD in her father.  ROS:   Please see the history of present illness.  Additional pertinent ROS: Constitutional: Negative for chills, fever, night sweats, unintentional weight loss  HENT: Negative for ear pain and hearing loss.   Eyes: Negative for loss of vision and eye pain.  Respiratory: Negative for cough, sputum, wheezing.   Cardiovascular:  See HPI. Gastrointestinal: Negative for abdominal pain, melena, and hematochezia.  Genitourinary: Negative for dysuria and hematuria.  Musculoskeletal: Negative for falls and myalgias.  Skin: Negative for itching and rash.  Neurological: Negative for focal weakness, focal sensory changes and loss of consciousness.  Endo/Heme/Allergies: Does not bruise/bleed easily.     EKGs/Labs/Other Studies Reviewed:    The following studies were reviewed today: No prior cardiac studies  EKG:  EKG is personally reviewed.  The ekg ordered today demonstrates normal sinus rhythm at 67 bpm with nonspecific ST pattern  Recent Labs: No results found for requested labs within last 8760 hours.  Recent Lipid Panel No results found for: CHOL, TRIG, HDL, CHOLHDL, VLDL, LDLCALC, LDLDIRECT  Physical Exam:    VS:  BP 110/70   Pulse 67   Temp (!) 97.3 F (36.3 C)   Ht _0  (1.651 m)   Wt 125 lb (56.7 kg)   SpO2 98%   BMI 20.80 kg/m     Wt Readings from Last 3 Encounters:  06/17/20 125 lb (56.7 kg)  02/18/20 126 lb (57.2 kg)  01/18/20 124 lb 12.8 oz (56.6 kg)    GEN: Well nourished, well developed in no acute distress HEENT: Normal, moist mucous membranes NECK: No JVD CARDIAC: regular rhythm, normal S1 and S2, no rubs or gallops. No murmurs. VASCULAR: Radial and DP pulses 2+ bilaterally. No carotid bruits RESPIRATORY:  Clear to auscultation without rales, wheezing or rhonchi  ABDOMEN: Soft, non-tender, non-distended MUSCULOSKELETAL:   Ambulates independently SKIN: Warm and dry, no edema NEUROLOGIC:  Alert and oriented x 3. No focal neuro deficits noted. PSYCHIATRIC:  Normal affect    ASSESSMENT:    1. Precordial pain   2. Pre-procedure lab exam   3. Cardiac risk counseling   4. Counseling on health promotion and disease prevention    PLAN:    Chest pain: -discussed treadmill stress, nuclear stress/lexiscan, and CT coronary angiography. Discussed pros and cons of each, including but not  limited to false positive/false negative risk, radiation risk, and risk of IV contrast dye. Based on shared decision making, decision was made to pursue CT coronary angiography. -can received IV beta blocker if needed -counseled on need to get BMET prior to test -counseled on use of sublingual nitroglycerin and its importance to a good test -if CT unremarkable, we discussed noncardiac causes of chest pain  Cardiac risk counseling and prevention recommendations: -recommend heart healthy/Mediterranean diet, with whole grains, fruits, vegetable, fish, lean meats, nuts, and olive oil. Limit salt. -recommend moderate walking, 3-5 times/week for 30-50 minutes each session. Aim for at least 150 minutes.week. Goal should be pace of 3 miles/hours, or walking 1.5 miles in 30 minutes -recommend avoidance of tobacco products. Avoid excess alcohol. -ASCVD risk score: The ASCVD Risk score Mikey Bussing DC Jr., et al., 2013) failed to calculate for the following reasons:   Cannot find a previous HDL lab   Cannot find a previous total cholesterol lab    Plan for follow up: if CT unremarkable, can follow up as needed  Dovey Fatzinger  Harrell Gave, MD, PhD [place]  [facility]    Medication Adjustments/Labs and Tests Ordered: Current medicines are reviewed at length with the patient today.  Concerns regarding medicines are outlined above.  Orders Placed This Encounter  Procedures  . CT CORONARY MORPH W/CTA COR W/SCORE W/CA W/CM &/OR WO/CM  . CT CORONARY FRACTIONAL FLOW RESERVE DATA PREP  . CT CORONARY FRACTIONAL FLOW RESERVE FLUID ANALYSIS  . Basic metabolic panel  . EKG 12-Lead   No orders of the defined types were placed in this encounter.   Patient Instructions  Medication Instructions:  Your Physician recommend you continue on your current medication as directed.    *If you need a refill on your cardiac medications before your next appointment, please call your pharmacy*   Lab Work: Your  physician recommends that you return for lab work 1 week prior to procedure ( BMP).  If you have labs (blood work) drawn today and your tests are completely normal, you will receive your results only by: Marland Kitchen MyChart Message (if you have MyChart) OR . A paper copy in the mail If you have any lab test that is abnormal or we need to change your treatment, we will call you to review the results.   Testing/Procedures: Cardiac CT Angiography (CTA), is a special type of CT scan that uses a computer to produce multi-dimensional views of major blood vessels throughout the body. In CT angiography, a contrast material is injected through an IV to help visualize the blood vessels [facility]   Follow-Up: At [facility], you and your health needs are our priority.  As part of our continuing mission to provide you with exceptional heart care, we have created designated Provider Care Teams.  These Care Teams include your primary Cardiologist (physician) and Advanced Practice Providers (APPs -  Physician Assistants and Nurse Practitioners) who all work together to provide you with the care you need, when you need it.  We recommend signing up for the patient portal called "MyChart".  Sign up information is provided on this After Visit Summary.  MyChart is used to connect with patients for Virtual Visits (Telemedicine).  Patients are able to view lab/test results, encounter notes, upcoming appointments, etc.  Non-urgent messages can be sent to your provider as well.   To learn more about what you can do with MyChart, go to [email or web address].    Your next appointment:   As needed  The format for your next appointment:   In Person  Provider:   Buford Dresser, MD  Your cardiac CT will be scheduled at one of the below locations:   [facility] [address] [place], [place] [address] [phone]   If scheduled at [facility], please arrive at the [facility]  main entrance of [facility] 30 minutes prior to test start time. Proceed to the [facility] Radiology Department (first floor) to check-in and test prep.  If scheduled at [facility], please arrive 15 mins early for check-in and test prep.  Please follow these instructions carefully (unless otherwise directed):  On the Night Before the Test: . Be sure to Drink plenty of water. . Do not consume any caffeinated/decaffeinated beverages or chocolate 12 hours prior to your test. . Do not take any antihistamines 12 hours prior to your test.  On the Day of the Test: . Drink plenty of water. Do not drink any water within one hour of the test. . Do not eat  any food 4 hours prior to the test. . You may take your regular medications prior to the test.  . FEMALES- please wear underwire-free bra if available       After the Test: . Drink plenty of water. . After receiving IV contrast, you may experience a mild flushed feeling. This is normal. . On occasion, you may experience a mild rash up to 24 hours after the test. This is not dangerous. If this occurs, you can take Benadryl 25 mg and increase your fluid intake. . If you experience trouble breathing, this can be serious. If it is severe call 911 IMMEDIATELY. If it is mild, please call our office. . If you take any of these medications: Glipizide/Metformin, Avandament, Glucavance, please do not take 48 hours after completing test unless otherwise instructed.   Once we have confirmed authorization from your insurance company, we will call you to set up a date and time for your test. Based on how quickly your insurance processes prior authorizations requests, please allow up to 4 weeks to be contacted for scheduling your Cardiac CT appointment. Be advised that routine Cardiac CT appointments could be scheduled as many as 8 weeks after your provider has ordered it.  For non-scheduling related questions, please contact  the cardiac imaging nurse navigator should you have any questions/concerns: Marchia Bond, Cardiac Imaging Nurse Navigator Burley Saver, Interim Cardiac Imaging Nurse [place] [facility] Services Direct Office Dial: [phone]   For scheduling needs, including cancellations and rescheduling, please call Vivien Rota at [phone], option 3.      Signed, Buford Dresser, MD PhD 06/17/2020    [place]

## 2020-06-17 NOTE — Patient Instructions (Addendum)
Medication Instructions:  Your Physician recommend you continue on your current medication as directed.    *If you need a refill on your cardiac medications before your next appointment, please call your pharmacy*   Lab Work: Your physician recommends that you return for lab work 1 week prior to procedure ( BMP).  If you have labs (blood work) drawn today and your tests are completely normal, you will receive your results only by: Marland Kitchen MyChart Message (if you have MyChart) OR . A paper copy in the mail If you have any lab test that is abnormal or we need to change your treatment, we will call you to review the results.   Testing/Procedures: Cardiac CT Angiography (CTA), is a special type of CT scan that uses a computer to produce multi-dimensional views of major blood vessels throughout the body. In CT angiography, a contrast material is injected through an IV to help visualize the blood vessels [facility]   Follow-Up: At [facility], you and your health needs are our priority.  As part of our continuing mission to provide you with exceptional heart care, we have created designated Provider Care Teams.  These Care Teams include your primary Cardiologist (physician) and Advanced Practice Providers (APPs -  Physician Assistants and Nurse Practitioners) who all work together to provide you with the care you need, when you need it.  We recommend signing up for the patient portal called "MyChart".  Sign up information is provided on this After Visit Summary.  MyChart is used to connect with patients for Virtual Visits (Telemedicine).  Patients are able to view lab/test results, encounter notes, upcoming appointments, etc.  Non-urgent messages can be sent to your provider as well.   To learn more about what you can do with MyChart, go to [email or web address].    Your next appointment:   As needed  The format for your next appointment:   In Person  Provider:   Buford Dresser, MD  Your cardiac CT will be scheduled at one of the below locations:   [facility] [address] [place], [place] [address] [phone]   If scheduled at [facility], please arrive at the [facility] main entrance of [facility] 30 minutes prior to test start time. Proceed to the [facility] Radiology Department (first floor) to check-in and test prep.  If scheduled at [facility], please arrive 15 mins early for check-in and test prep.  Please follow these instructions carefully (unless otherwise directed):  On the Night Before the Test: . Be sure to Drink plenty of water. . Do not consume any caffeinated/decaffeinated beverages or chocolate 12 hours prior to your test. . Do not take any antihistamines 12 hours prior to your test.  On the Day of the Test: . Drink plenty of water. Do not drink any water within one hour of the test. . Do not eat any food 4 hours prior to the test. . You may take your regular medications prior to the test.  . FEMALES- please wear underwire-free bra if available       After the Test: . Drink plenty of water. . After receiving IV contrast, you may experience a mild flushed feeling. This is normal. . On occasion, you may experience a mild rash up to 24 hours after the test. This is not dangerous. If this occurs, you can take Benadryl 25 mg and increase your fluid intake. . If you experience trouble breathing, this  can be serious. If it is severe call 911 IMMEDIATELY. If it is mild, please call our office. . If you take any of these medications: Glipizide/Metformin, Avandament, Glucavance, please do not take 48 hours after completing test unless otherwise instructed.   Once we have confirmed authorization from your insurance company, we will call you to set up a date and time for your test. Based on how quickly your insurance processes prior authorizations requests, please allow up  to 4 weeks to be contacted for scheduling your Cardiac CT appointment. Be advised that routine Cardiac CT appointments could be scheduled as many as 8 weeks after your provider has ordered it.  For non-scheduling related questions, please contact the cardiac imaging nurse navigator should you have any questions/concerns: Marchia Bond, Cardiac Imaging Nurse Navigator Burley Saver, Interim Cardiac Imaging Nurse [place] and V[facility] Services Direct Office Dial: [phone]   For scheduling needs, including cancellations and rescheduling, please call Vivien Rota at [phone], option 3.

## 2020-07-11 ENCOUNTER — Ambulatory Visit (HOSPITAL_COMMUNITY): Payer: BC Managed Care – PPO

## 2020-07-12 ENCOUNTER — Encounter: Payer: Self-pay | Admitting: Cardiology

## 2020-07-12 ENCOUNTER — Telehealth (HOSPITAL_COMMUNITY): Payer: Self-pay | Admitting: *Deleted

## 2020-07-12 NOTE — Progress Notes (Signed)
Will scan copy of labs from [place] dated 07/05/20 into chart. Summary:  Glucose 95 BUN 13 Cr 0.90 eGFR 68 Sodium 138 Potassium 4.1 Cl 105 CO2 26 Ca 8.9 Total protein 6.3 Albumin 4.2 Globulin 2.1 Total bilirubin 0.6 Alk Phos 61 AST 21 ALT 14  Buford Dresser, MD, PhD [facility]  [address] [place], [place] [address] [phone]

## 2020-07-12 NOTE — Telephone Encounter (Signed)
Reaching out to patient to offer assistance regarding upcoming cardiac imaging study; pt verbalizes understanding of appt date/time, parking situation and where to check in, pre-test NPO status and medications ordered, and verified current allergies; name and call back number provided for further questions should they arise  Lissy Deuser Tai RN Navigator Cardiac Imaging [facility] [phone] office [phone] cell 

## 2020-07-13 ENCOUNTER — Ambulatory Visit (HOSPITAL_COMMUNITY): Payer: BC Managed Care – PPO

## 2020-07-14 ENCOUNTER — Other Ambulatory Visit: Payer: Self-pay

## 2020-07-14 ENCOUNTER — Ambulatory Visit (HOSPITAL_COMMUNITY)
Admission: RE | Admit: 2020-07-14 | Discharge: 2020-07-14 | Disposition: A | Discharge status: Home / Self Care | Payer: BC Managed Care – PPO | Source: Ambulatory Visit | Attending: Cardiology | Admitting: Cardiology

## 2020-07-14 DIAGNOSIS — R072 Precordial pain: Secondary | ICD-10-CM | POA: Insufficient documentation

## 2020-07-14 MED ORDER — NITROGLYCERIN 0.4 MG SL SUBL
0.8000 mg | SUBLINGUAL_TABLET | Freq: Once | SUBLINGUAL | Status: AC
  Administered 2020-07-14: 0.8 mg via SUBLINGUAL

## 2020-07-14 MED ORDER — IOHEXOL 350 MG/ML SOLN
80.0000 mL | Freq: Once | INTRAVENOUS | Status: AC | PRN
  Administered 2020-07-14: 80 mL via INTRAVENOUS

## 2020-07-14 MED ORDER — NITROGLYCERIN 0.4 MG SL SUBL
SUBLINGUAL_TABLET | SUBLINGUAL | Status: AC
  Filled 2020-07-14: qty 2

## 2020-07-19 DIAGNOSIS — A6920 Lyme disease, unspecified: Secondary | ICD-10-CM | POA: Diagnosis not present

## 2020-07-19 DIAGNOSIS — Z77120 Contact with and (suspected) exposure to mold (toxic): Secondary | ICD-10-CM | POA: Diagnosis not present

## 2020-07-19 DIAGNOSIS — G3184 Mild cognitive impairment, so stated: Secondary | ICD-10-CM | POA: Diagnosis not present

## 2020-07-25 DIAGNOSIS — M25572 Pain in left ankle and joints of left foot: Secondary | ICD-10-CM | POA: Diagnosis not present

## 2020-08-11 DIAGNOSIS — Z77120 Contact with and (suspected) exposure to mold (toxic): Secondary | ICD-10-CM | POA: Diagnosis not present

## 2020-08-11 DIAGNOSIS — R5382 Chronic fatigue, unspecified: Secondary | ICD-10-CM | POA: Diagnosis not present

## 2020-08-11 DIAGNOSIS — A6920 Lyme disease, unspecified: Secondary | ICD-10-CM | POA: Diagnosis not present

## 2020-08-11 DIAGNOSIS — G3184 Mild cognitive impairment, so stated: Secondary | ICD-10-CM | POA: Diagnosis not present

## 2020-08-14 ENCOUNTER — Encounter: Payer: Self-pay | Admitting: Cardiology

## 2020-09-04 DIAGNOSIS — Z20822 Contact with and (suspected) exposure to covid-19: Secondary | ICD-10-CM | POA: Diagnosis not present

## 2020-09-21 ENCOUNTER — Telehealth: Payer: Self-pay | Admitting: Cardiology

## 2020-09-21 NOTE — Telephone Encounter (Signed)
Pre-op covering staff, we are going to need more information? Can you please find out what type of surgery and the surgeon's office so that we can request a clearance form.   Thank you!

## 2020-09-21 NOTE — Telephone Encounter (Signed)
New Message:     Pt needs surgery, she needs clearance. Her surgeon will;l not send or call a clearance in for her. She would like for you to do a clearance for her and she will come pick it up please.

## 2020-09-21 NOTE — Telephone Encounter (Signed)
I called Dr. Verdell Carmine office to confirm what procedure is being performed, will also need anesthesia. Need to confirm with surgeon's office. Left message for Diannia Ruder, surgery scheduler to please return my call to confirm.

## 2020-09-21 NOTE — Telephone Encounter (Signed)
   [facility] Pre-operative Risk Assessment      Primary Cardiologist  Dr Harrell Gave   Request for surgical clearance:  1. What type of surgery is being performed?  Bilateral Breast Implant Removal   2. When is this surgery scheduled?  Feb 22 ,2021----[place] ,[place]  3. What type of clearance is required (medical clearance vs. Pharmacy clearance to hold med vs. Both)? Medical   4. Are there any medications that need to be held prior to surgery and how long? N/A  5. Practice name and name of physician performing surgery?  [facility] ;  Dr Kennis Carina       Address [address] [place] ,[place] [address]  6. What is the office phone number?  [phone]   7.   What is the office fax number? Julian  8.   Anesthesia type (None, local, MAC, general) ? unknown   Raiford Simmonds 09/21/2020, 4:33 PM  _________________________________________________________________   (provider comments below)

## 2020-09-21 NOTE — Telephone Encounter (Signed)
Spoke with patient information obtained and will send message to pre op  pool

## 2020-09-22 NOTE — Telephone Encounter (Signed)
Left message to call back and ask to speak with pre-op team.  Corrin Parker, PA-C 09/22/2020 10:03 AM

## 2020-09-23 NOTE — Telephone Encounter (Signed)
S/w Dr. Verdell Carmine office to confirm clearance request. I confirmed today the procedure as well date of surgery, type of anesthesia to be used, ph and fax #'s all confirmed. Dr. Verdell Carmine office did also state they will need most recent labs and EKG that has been. I thanked Dr. Verdell Carmine office for their help. I assured their office that I once clearance has been given we will send clearance.

## 2020-09-23 NOTE — Telephone Encounter (Signed)
Tried to call Phone: [phone], message says "call cannot be completed as dialed"??

## 2020-09-26 NOTE — Telephone Encounter (Signed)
   Primary Cardiologist: Jodelle Red, MD  Chart reviewed as part of pre-operative protocol coverage. Patient was contacted 09/26/2020 in reference to pre-operative risk assessment for pending surgery as outlined below.  Michelle Nolan was last seen on 06/17/20 by Dr. Cristal Deer.  Since that day, Michelle Nolan has done well. She had a CT coronary for atypical chest pain which revealed calcium score of zero. She can complete more than 4.0 METS, including running 3 miles, without chest pain.   Therefore, based on ACC/AHA guidelines, the patient would be at acceptable risk for the planned procedure without further cardiovascular testing.   The patient was advised that if she develops new symptoms prior to surgery to contact our office to arrange for a follow-up visit, and she verbalized understanding.  I will route this recommendation to the requesting party via Epic fax function and remove from pre-op pool. Please call with questions.  Roe Rutherford Mallory Enriques, PA 09/26/2020, 9:53 AM

## 2020-10-06 DIAGNOSIS — Z20822 Contact with and (suspected) exposure to covid-19: Secondary | ICD-10-CM | POA: Diagnosis not present

## 2020-10-21 DIAGNOSIS — Z20822 Contact with and (suspected) exposure to covid-19: Secondary | ICD-10-CM | POA: Diagnosis not present

## 2020-11-14 DIAGNOSIS — E618 Deficiency of other specified nutrient elements: Secondary | ICD-10-CM | POA: Diagnosis not present

## 2020-11-14 DIAGNOSIS — K5900 Constipation, unspecified: Secondary | ICD-10-CM | POA: Diagnosis not present

## 2020-11-14 DIAGNOSIS — E785 Hyperlipidemia, unspecified: Secondary | ICD-10-CM | POA: Diagnosis not present

## 2020-11-14 DIAGNOSIS — M810 Age-related osteoporosis without current pathological fracture: Secondary | ICD-10-CM | POA: Diagnosis not present

## 2020-11-14 DIAGNOSIS — M797 Fibromyalgia: Secondary | ICD-10-CM | POA: Diagnosis not present

## 2020-11-14 DIAGNOSIS — Z77120 Contact with and (suspected) exposure to mold (toxic): Secondary | ICD-10-CM | POA: Diagnosis not present

## 2020-11-14 DIAGNOSIS — R634 Abnormal weight loss: Secondary | ICD-10-CM | POA: Diagnosis not present

## 2020-11-14 DIAGNOSIS — L709 Acne, unspecified: Secondary | ICD-10-CM | POA: Diagnosis not present

## 2020-11-14 DIAGNOSIS — G4700 Insomnia, unspecified: Secondary | ICD-10-CM | POA: Diagnosis not present

## 2020-11-14 DIAGNOSIS — E162 Hypoglycemia, unspecified: Secondary | ICD-10-CM | POA: Diagnosis not present

## 2020-11-14 DIAGNOSIS — B389 Coccidioidomycosis, unspecified: Secondary | ICD-10-CM | POA: Diagnosis not present

## 2020-11-14 DIAGNOSIS — I499 Cardiac arrhythmia, unspecified: Secondary | ICD-10-CM | POA: Diagnosis not present

## 2020-11-18 DIAGNOSIS — R638 Other symptoms and signs concerning food and fluid intake: Secondary | ICD-10-CM | POA: Diagnosis not present

## 2020-11-18 DIAGNOSIS — R0981 Nasal congestion: Secondary | ICD-10-CM | POA: Diagnosis not present

## 2020-11-18 DIAGNOSIS — R5382 Chronic fatigue, unspecified: Secondary | ICD-10-CM | POA: Diagnosis not present

## 2020-11-18 DIAGNOSIS — E559 Vitamin D deficiency, unspecified: Secondary | ICD-10-CM | POA: Diagnosis not present

## 2020-11-18 DIAGNOSIS — Z77120 Contact with and (suspected) exposure to mold (toxic): Secondary | ICD-10-CM | POA: Diagnosis not present

## 2020-11-18 DIAGNOSIS — A6920 Lyme disease, unspecified: Secondary | ICD-10-CM | POA: Diagnosis not present

## 2020-11-18 DIAGNOSIS — G3184 Mild cognitive impairment, so stated: Secondary | ICD-10-CM | POA: Diagnosis not present

## 2020-11-24 DIAGNOSIS — G3184 Mild cognitive impairment, so stated: Secondary | ICD-10-CM | POA: Diagnosis not present

## 2020-11-24 DIAGNOSIS — R0981 Nasal congestion: Secondary | ICD-10-CM | POA: Diagnosis not present

## 2020-11-24 DIAGNOSIS — A6920 Lyme disease, unspecified: Secondary | ICD-10-CM | POA: Diagnosis not present

## 2020-11-24 DIAGNOSIS — E559 Vitamin D deficiency, unspecified: Secondary | ICD-10-CM | POA: Diagnosis not present

## 2020-11-24 DIAGNOSIS — R638 Other symptoms and signs concerning food and fluid intake: Secondary | ICD-10-CM | POA: Diagnosis not present

## 2020-11-24 DIAGNOSIS — Z77120 Contact with and (suspected) exposure to mold (toxic): Secondary | ICD-10-CM | POA: Diagnosis not present

## 2020-11-24 DIAGNOSIS — R5382 Chronic fatigue, unspecified: Secondary | ICD-10-CM | POA: Diagnosis not present

## 2020-12-01 DIAGNOSIS — Z20822 Contact with and (suspected) exposure to covid-19: Secondary | ICD-10-CM | POA: Diagnosis not present

## 2021-01-14 DIAGNOSIS — Z23 Encounter for immunization: Secondary | ICD-10-CM | POA: Diagnosis not present

## 2021-01-19 ENCOUNTER — Other Ambulatory Visit: Payer: Self-pay

## 2021-01-19 ENCOUNTER — Other Ambulatory Visit: Payer: Self-pay | Admitting: Neurological Surgery

## 2021-01-19 ENCOUNTER — Emergency Department (HOSPITAL_COMMUNITY): Payer: Medicare Other

## 2021-01-19 ENCOUNTER — Emergency Department (HOSPITAL_BASED_OUTPATIENT_CLINIC_OR_DEPARTMENT_OTHER): Payer: Medicare Other

## 2021-01-19 ENCOUNTER — Ambulatory Visit
Admission: RE | Admit: 2021-01-19 | Discharge: 2021-01-19 | Disposition: A | Discharge status: Home / Self Care | Payer: Medicare Other | Source: Ambulatory Visit | Attending: Neurological Surgery | Admitting: Neurological Surgery

## 2021-01-19 ENCOUNTER — Emergency Department (HOSPITAL_BASED_OUTPATIENT_CLINIC_OR_DEPARTMENT_OTHER)
Admission: EM | Admit: 2021-01-19 | Discharge: 2021-01-19 | Disposition: A | Discharge status: Home / Self Care | Payer: Medicare Other | Attending: Emergency Medicine | Admitting: Emergency Medicine

## 2021-01-19 DIAGNOSIS — E039 Hypothyroidism, unspecified: Secondary | ICD-10-CM | POA: Insufficient documentation

## 2021-01-19 DIAGNOSIS — R200 Anesthesia of skin: Secondary | ICD-10-CM

## 2021-01-19 DIAGNOSIS — Z79899 Other long term (current) drug therapy: Secondary | ICD-10-CM | POA: Diagnosis not present

## 2021-01-19 DIAGNOSIS — R29898 Other symptoms and signs involving the musculoskeletal system: Secondary | ICD-10-CM | POA: Diagnosis not present

## 2021-01-19 DIAGNOSIS — Z87891 Personal history of nicotine dependence: Secondary | ICD-10-CM | POA: Diagnosis not present

## 2021-01-19 DIAGNOSIS — J3489 Other specified disorders of nose and nasal sinuses: Secondary | ICD-10-CM | POA: Diagnosis not present

## 2021-01-19 DIAGNOSIS — R519 Headache, unspecified: Secondary | ICD-10-CM | POA: Insufficient documentation

## 2021-01-19 LAB — ETHANOL: Alcohol, Ethyl (B): <10 mg/dL (ref <10)

## 2021-01-19 LAB — COMPREHENSIVE METABOLIC PANEL
ALT: 11 U/L (ref 0–44)
AST: 17 U/L (ref 15–41)
Albumin: 4.3 g/dL (ref 3.5–5.0)
Alkaline Phosphatase: 62 U/L (ref 38–126)
Anion gap: 7 (ref 5–15)
BUN: 18 mg/dL (ref 8–23)
CO2: 26 mmol/L (ref 22–32)
Calcium: 9.4 mg/dL (ref 8.9–10.3)
Chloride: 103 mmol/L (ref 98–111)
Creatinine, Ser: 0.73 mg/dL (ref 0.44–1.00)
GFR, Estimated: >60 mL/min (ref >60)
Glucose, Bld: 89 mg/dL (ref 70–99)
Potassium: 4.4 mmol/L (ref 3.5–5.1)
Sodium: 136 mmol/L (ref 135–145)
Total Bilirubin: 0.3 mg/dL (ref 0.3–1.2)
Total Protein: 6.9 g/dL (ref 6.5–8.1)

## 2021-01-19 LAB — DIFFERENTIAL
Abs Immature Granulocytes: 0.01 10*3/uL (ref 0.00–0.07)
Basophils Absolute: 0 10*3/uL (ref 0.0–0.1)
Basophils Relative: 1 %
Eosinophils Absolute: 0.3 10*3/uL (ref 0.0–0.5)
Eosinophils Relative: 4 %
Immature Granulocytes: 0 %
Lymphocytes Relative: 29 %
Lymphs Abs: 1.9 10*3/uL (ref 0.7–4.0)
Monocytes Absolute: 0.7 10*3/uL (ref 0.1–1.0)
Monocytes Relative: 10 %
Neutro Abs: 3.7 10*3/uL (ref 1.7–7.7)
Neutrophils Relative %: 56 %

## 2021-01-19 LAB — CBC
HCT: 37.1 % (ref 36.0–46.0)
Hemoglobin: 12.2 g/dL (ref 12.0–15.0)
MCH: 30.7 pg (ref 26.0–34.0)
MCHC: 32.9 g/dL (ref 30.0–36.0)
MCV: 93.5 fL (ref 80.0–100.0)
Platelets: 234 10*3/uL (ref 150–400)
RBC: 3.97 MIL/uL (ref 3.87–5.11)
RDW: 12.5 % (ref 11.5–15.5)
WBC: 6.5 10*3/uL (ref 4.0–10.5)
nRBC: 0 % (ref 0.0–0.2)

## 2021-01-19 NOTE — ED Notes (Signed)
Patient transported to CT 

## 2021-01-19 NOTE — Consult Note (Signed)
[facility] TeleNeurology Consult Services    Date of Service:  01/19/2021    Impression: Acute onset of left arm and leg sensory changes, left face and retroorbital pressure sensation and word finding difficulty    Metrics: Last Known Well: 1130 Symptoms: As per HPI.  Patient is not a candidate for thrombolytic: NIHSS of 0    ED Physician notified of diagnostic impression and management plan at: 2:49 PM   Assessment: 65 year old female with acute onset of left arm and leg sensory changes, left face and retroorbital pressure sensation and word finding difficulty   - Exam reveals fluent speech with intact comprehension and naming. There was one instance of stuttering speech that did not have the quality of a lesional dysphasia, which occurred when her speech symptoms were specifically addressed during the interview. Speech during the remainder of the interview was fluent. Some left hip flexion weakness (patient states this is chronic x 1 year) against resistance, but no drift and RLE strength otherwise normal. NIHSS 0 - DDx for presentation includes an acute lacunar infarction, MS, stress related symptoms and psychogenic pseudostroke     Recommendations: - MRI brain. The patient will need to be transported to [facility] for this. - ASA 325 mg po x 1 now. Consider starting prophylactic daily ASA at 81 mg po qd       ------------------------------------------------------------------------------   History of Present Illness: The patient is a 65 year old female presenting to the ED with acute onset of left arm and leg sensory changes, left face and retroorbital pressure sensation and word finding difficulty. She first noticed the symptoms at 11:30 AM. She does endorse an approximately 1-2 year history of subtle chronic weakness on the left relative to her right side; MRI obtained for this in November of 2020 was negative. The sensory changes on the left are difficult for the patient to  specify; she does not endorse actual sensory loss, but does note an odd sensation to her left face, arm and leg that is difficult to further specify. She did wake up in the middle of the night with acute onset of dizziness, which she states was not vertiginous but had a presyncopal as well as a rocking or to-and-fro quality to it. She went back to sleep and the dizziness has not recurred. She endorses some difficulty with word finding earlier today and states that she still has this symptom.      Past Medical History: Past Medical History:  Diagnosis Date  . Chondromalacia of right patella   . Hypothyroidism      Past Surgical History: Past Surgical History:  Procedure Laterality Date  . AUGMENTATION MAMMAPLASTY Bilateral   . BREAST ENHANCEMENT SURGERY Bilateral   . CESAREAN SECTION     x2  . KNEE ARTHROSCOPY Left   . KNEE ARTHROSCOPY WITH LATERAL MENISECTOMY Right 03/17/2015   Procedure: KNEE ARTHROSCOPY WITH PARTIAL LATERAL MENISECTOMY;  Surgeon: Mckinley Jewel, MD;  Location: [facility];  Service: Orthopedics;  Laterality: Right;  . KNEE ARTHROSCOPY WITH MEDIAL MENISECTOMY Right 03/17/2015   Procedure: RIGHT KNEE ARTHROSCOPY CHONDROPLASTY WITH MEDIAL MENISCECTOMY;  Surgeon: Mckinley Jewel, MD;  Location: [facility];  Service: Orthopedics;  Laterality: Right;  . WRIST SURGERY Left      Medications:  Current Meds  Medication Sig  . Calcium Carbonate-Vit D-Min (CALCIUM 1200 PO) Take 1 tablet by mouth.  . escitalopram (LEXAPRO) 10 MG tablet escitalopram 10 mg tablet  Take 1 tablet every day  by oral route.  Marland Kitchen FISH OIL-COENZYME Q10 PO Take by mouth.  . progesterone (PROMETRIUM) 100 MG capsule Take 100 mg by mouth daily.  Marland Kitchen VITAMIN D, CHOLECALCIFEROL, PO Take 1 tablet by mouth daily.  Estrogen/Testosterone formulation (provided by a Set designer)     Social History: Drug Use: None Former smoker   Family History:  Reviewed in Epic   ROS: As per  HPI    Anticoagulant use:  None   Antiplatelet use: None   Examination:   BP 133/79   Pulse 76   Temp 98.1 F (36.7 C) (Oral)   Resp 11   Ht 5\' 5"  (1.651 m)   Wt 54.4 kg   SpO2 100%   BMI 19.97 kg/m     1A: Level of Consciousness - 0 1B: Ask Month and Age - 0 1C: Blink Eyes & Squeeze Hands - 0 2: Test Horizontal Extraocular Movements - 0 3: Test Visual Fields - 0 4: Test Facial Palsy (Use Grimace if Obtunded) - 0 5A: Test Left Arm Motor Drift - 0 5B: Test Right Arm Motor Drift - 0 6A: Test Left Leg Motor Drift - 0 6B: Test Right Leg Motor Drift - 0 7: Test Limb Ataxia (FNF/Heel-Shin) - 0 8: Test Sensation -  0 9: Test Language/Aphasia - 0 10: Test Dysarthria - Severe Dysarthria: 0 11: Test Extinction/Inattention - Extinction to bilateral simultaneous stimulation 0   NIHSS Score: 0   Pre-Morbid Modified Rankin Scale: 0     Patient was informed the Neurology Consult would occur via TeleHealth consult by way of interactive audio and video telecommunications and consented to receiving care in this manner.     Electronically signed: Dr. 

## 2021-01-19 NOTE — ED Triage Notes (Signed)
Pt states that she woke up last night and didn't feel right. At 1130 this morning Pt stated that the left side of her face feels different, her left eye feels different. Pt stated thst she also started having trouble forming and retiriving words but is not experiecing aphasia at this time.

## 2021-01-19 NOTE — Discharge Instructions (Signed)
Our recommendation is to get an MRI today.  Please go right away to the imaging center.  Return at anytime you would like to be seen in our system.

## 2021-01-19 NOTE — ED Notes (Signed)
Tele Neurologist N. Kimmie on screen

## 2021-01-19 NOTE — ED Provider Notes (Signed)
[facility] EMERGENCY DEPT Provider Note   CSN: [id number] Arrival date & time: 01/19/21  1331     History No chief complaint on file.   Michelle Nolan is a 65 y.o. female.  66 yo F with a chief complaints of left-sided decreased sensation to light touch.  She has noticed this since about 11:00 today.  She has felt mildly off since last night but has trouble quantifying it.  Refers to it is feeling a little bit tired.  Had episode in the middle the night where she woke up and feels dizzy but went back to sleep and that seems to have resolved.  She also has a left-sided headache.  Has had some trouble with word finding earlier in the day feels like the left side of her body still feels less than the right.  Worse to the left side of her face and left upper extremity.  The history is provided by the patient.  Illness Severity:  Severe Onset quality:  Sudden Duration:  3 hours Timing:  Constant Progression:  Unchanged Chronicity:  New Associated symptoms: no chest pain, no congestion, no fever, no headaches, no myalgias, no nausea, no rhinorrhea, no shortness of breath, no vomiting and no wheezing        Past Medical History:  Diagnosis Date  . Chondromalacia of right patella   . Hypothyroidism     Patient Active Problem List   Diagnosis Date Noted  . OSA (obstructive sleep apnea) 01/26/2020  . Shortness of breath 12/25/2016  . GERD (gastroesophageal reflux disease) 12/25/2016  . Asthma 08/09/2011  . HYPOTHYROIDISM 01/18/2009  . Cough 01/18/2009    Past Surgical History:  Procedure Laterality Date  . AUGMENTATION MAMMAPLASTY Bilateral   . BREAST ENHANCEMENT SURGERY Bilateral   . CESAREAN SECTION     x2  . KNEE ARTHROSCOPY Left   . KNEE ARTHROSCOPY WITH LATERAL MENISECTOMY Right 03/17/2015   Procedure: KNEE ARTHROSCOPY WITH PARTIAL LATERAL MENISECTOMY;  Surgeon: Mckinley Jewel, MD;  Location: [facility];  Service: Orthopedics;  Laterality:  Right;  . KNEE ARTHROSCOPY WITH MEDIAL MENISECTOMY Right 03/17/2015   Procedure: RIGHT KNEE ARTHROSCOPY CHONDROPLASTY WITH MEDIAL MENISCECTOMY;  Surgeon: Mckinley Jewel, MD;  Location: [facility];  Service: Orthopedics;  Laterality: Right;  . WRIST SURGERY Left      OB History   No obstetric history on file.     Family History  Problem Relation Age of Onset  . COPD Father   . Breast cancer Mother     Social History   Tobacco Use  . Smoking status: Former Smoker    Packs/day: 0.30    Years: 5.00    Pack years: 1.50    Types: Cigarettes    Quit date: 10/15/1978    Years since quitting: 42.2  . Smokeless tobacco: Never Used  Substance Use Topics  . Alcohol use: Yes    Comment: social  . Drug use: No    Home Medications Prior to Admission medications   Medication Sig Start Date End Date Taking? Authorizing Provider  Calcium Carbonate-Vit D-Min (CALCIUM 1200 PO) Take 1 tablet by mouth.   Yes [provider]  escitalopram (LEXAPRO) 10 MG tablet escitalopram 10 mg tablet  Take 1 tablet every day by oral route.   Yes [provider]  FISH OIL-COENZYME Q10 PO Take by mouth.   Yes [provider]  progesterone (PROMETRIUM) 100 MG capsule Take 100 mg by mouth daily.   Yes [provider]  VITAMIN D, CHOLECALCIFEROL, PO Take 1 tablet by mouth daily.   Yes [provider]    Allergies    Codeine and Sulfonamide derivatives  Review of Systems   Review of Systems  Constitutional: Negative for chills and fever.  HENT: Negative for congestion and rhinorrhea.   Eyes: Negative for redness and visual disturbance.  Respiratory: Negative for shortness of breath and wheezing.   Cardiovascular: Negative for chest pain and palpitations.  Gastrointestinal: Negative for nausea and vomiting.  Genitourinary: Negative for dysuria and urgency.  Musculoskeletal: Positive for neck pain. Negative for arthralgias and myalgias.  Skin:  Negative for pallor and wound.  Neurological: Positive for numbness. Negative for dizziness and headaches.    Physical Exam Updated Vital Signs BP (!) 143/89 (BP Location: Right Arm)   Pulse 76   Temp 99.1 F (37.3 C) (Oral)   Resp 16   Ht 5\' 5"  (1.651 m)   Wt 54.4 kg   SpO2 100%   BMI 19.97 kg/m   Physical Exam Vitals and nursing note reviewed.  Constitutional:      General: She is not in acute distress.    Appearance: She is well-developed. She is not diaphoretic.  HENT:     Head: Normocephalic and atraumatic.  Eyes:     Pupils: Pupils are equal, round, and reactive to light.  Cardiovascular:     Rate and Rhythm: Normal rate and regular rhythm.     Heart sounds: No murmur heard. No friction rub. No gallop.   Pulmonary:     Effort: Pulmonary effort is normal.     Breath sounds: No wheezing or rales.  Abdominal:     General: There is no distension.     Palpations: Abdomen is soft.     Tenderness: There is no abdominal tenderness.  Musculoskeletal:        General: No tenderness.     Cervical back: Normal range of motion and neck supple.  Skin:    General: Skin is warm and dry.  Neurological:     Mental Status: She is alert and oriented to person, place, and time.     GCS: GCS eye subscore is 4. GCS verbal subscore is 5. GCS motor subscore is 6.     Cranial Nerves: Cranial nerves are intact.     Sensory: Sensation is intact.     Motor: Motor function is intact.     Coordination: Coordination is intact.     Gait: Gait is intact.     Comments: Subjective decrease sensation to the left face and left upper arm.  Otherwise benign neurologic exam.  Psychiatric:        Behavior: Behavior normal.     ED Results / Procedures / Treatments   Labs (all labs ordered are listed, but only abnormal results are displayed) Labs Reviewed  RESP PANEL BY RT-PCR (FLU A&B, COVID) ARPGX2  CBC  DIFFERENTIAL  COMPREHENSIVE METABOLIC PANEL  ETHANOL  RAPID URINE DRUG SCREEN, HOSP  PERFORMED  URINALYSIS, ROUTINE W REFLEX MICROSCOPIC    EKG None  Radiology CT HEAD CODE STROKE WO CONTRAST  Result Date: 01/19/2021 CLINICAL DATA:  Code stroke.  Aphasia, left-sided weakness EXAM: CT HEAD WITHOUT CONTRAST TECHNIQUE: Contiguous axial images were obtained from the base of the skull through the vertex without intravenous contrast. COMPARISON:  None. FINDINGS: Brain: No acute intracranial hemorrhage, mass effect, or edema. Ventricles and sulci are within normal limits in size and configuration. Gray-white differentiation is preserved. There  is no extra-axial collection. Vascular: No hyperdense vessel. Skull: Unremarkable. Sinuses/Orbits: Aerated.  Orbits are unremarkable. Other: Mastoid air cells are clear. ASPECTS (Alberta Stroke Program Early CT Score) - Ganglionic level infarction (caudate, lentiform nuclei, internal capsule, insula, M1-M3 cortex): 7 - Supraganglionic infarction (M4-M6 cortex): 3 Total score (0-10 with 10 being normal): 10 IMPRESSION: There is no acute intracranial hemorrhage or evidence of acute infarction. ASPECT score is 10. These results were called by telephone at the time of interpretation on 01/19/2021 at 2:15 pm to provider Sharman Garrott , who verbally acknowledged these results. Electronically Signed   By: Guadlupe Spanish M.D.   On: 01/19/2021 14:23    Procedures Procedures   Medications Ordered in ED Medications - No data to display  ED Course  I have reviewed the triage vital signs and the nursing notes.  Pertinent labs & imaging results that were available during my care of the patient were reviewed by me and considered in my medical decision making (see chart for details).    MDM Rules/Calculators/A&P                          65 yo F with a chief complaint of decreased sensation to light touch to the left side of her face and left upper extremity.  She noticed this about 11:00 this morning.  Has a left-sided headache as well.  No obvious deficit on my  exam.  As the patient had acute onset of neurologic symptoms I did make her a code stroke.  Discussed the case with Dr. Otelia Limes who will see her via teleneurology.  Seen by neuro, felt not to be TPA candidate.  Recommends MRI.  Patient would prefer not to be transferred for MRI.  She was able to call and schedule an outpatient MRI to happen this afternoon.  She plans to drive straight there from our facility.  Discussed with her the reason for the urgency to get the MRI today and she states consents.  We will have her return anytime she wishes to be seen here.  3:05 PM:  I have discussed the diagnosis/risks/treatment options with the patient and believe the pt to be eligible for discharge home to follow-up with Neuro. We also discussed returning to the ED immediately if new or worsening sx occur. We discussed the sx which are most concerning (e.g., sudden worsening pain, fever, inability to tolerate by mouth) that necessitate immediate return. Medications administered to the patient during their visit and any new prescriptions provided to the patient are listed below.  Medications given during this visit Medications - No data to display   The patient appears reasonably screen and/or stabilized for discharge and I doubt any other medical condition or other [facility] requiring further screening, evaluation, or treatment in the ED at this time prior to discharge.     Final Clinical Impression(s) / ED Diagnoses Final diagnoses:  Left sided numbness    Rx / DC Orders ED Discharge Orders         Ordered    Ambulatory referral to Neurology       Comments: Left sided numbness   01/19/21 1505           Melene Plan, DO 01/19/21 1505

## 2021-02-20 DIAGNOSIS — M79642 Pain in left hand: Secondary | ICD-10-CM | POA: Diagnosis not present

## 2021-03-17 DIAGNOSIS — N63 Unspecified lump in unspecified breast: Secondary | ICD-10-CM | POA: Diagnosis not present

## 2021-03-21 ENCOUNTER — Other Ambulatory Visit: Payer: Self-pay | Admitting: Obstetrics and Gynecology

## 2021-03-21 DIAGNOSIS — N63 Unspecified lump in unspecified breast: Secondary | ICD-10-CM

## 2021-04-19 ENCOUNTER — Other Ambulatory Visit: Payer: Self-pay | Admitting: Obstetrics and Gynecology

## 2021-04-19 ENCOUNTER — Ambulatory Visit
Admission: RE | Admit: 2021-04-19 | Discharge: 2021-04-19 | Disposition: A | Discharge status: Home / Self Care | Payer: Medicare Other | Source: Ambulatory Visit | Attending: Obstetrics and Gynecology | Admitting: Obstetrics and Gynecology

## 2021-04-19 ENCOUNTER — Other Ambulatory Visit: Payer: Self-pay

## 2021-04-19 DIAGNOSIS — R928 Other abnormal and inconclusive findings on diagnostic imaging of breast: Secondary | ICD-10-CM

## 2021-04-19 DIAGNOSIS — N63 Unspecified lump in unspecified breast: Secondary | ICD-10-CM

## 2021-05-15 DIAGNOSIS — Z124 Encounter for screening for malignant neoplasm of cervix: Secondary | ICD-10-CM | POA: Diagnosis not present

## 2021-05-15 DIAGNOSIS — H2513 Age-related nuclear cataract, bilateral: Secondary | ICD-10-CM | POA: Diagnosis not present

## 2021-05-15 DIAGNOSIS — H524 Presbyopia: Secondary | ICD-10-CM | POA: Diagnosis not present

## 2021-05-15 DIAGNOSIS — H25013 Cortical age-related cataract, bilateral: Secondary | ICD-10-CM | POA: Diagnosis not present

## 2021-05-15 DIAGNOSIS — Z973 Presence of spectacles and contact lenses: Secondary | ICD-10-CM | POA: Diagnosis not present

## 2021-05-15 DIAGNOSIS — H2511 Age-related nuclear cataract, right eye: Secondary | ICD-10-CM | POA: Diagnosis not present

## 2021-05-18 ENCOUNTER — Other Ambulatory Visit: Payer: Self-pay | Admitting: Obstetrics and Gynecology

## 2021-05-18 DIAGNOSIS — M8580 Other specified disorders of bone density and structure, unspecified site: Secondary | ICD-10-CM

## 2021-06-13 DIAGNOSIS — H25811 Combined forms of age-related cataract, right eye: Secondary | ICD-10-CM | POA: Diagnosis not present

## 2021-06-13 DIAGNOSIS — H2511 Age-related nuclear cataract, right eye: Secondary | ICD-10-CM | POA: Diagnosis not present

## 2021-06-28 DIAGNOSIS — H2512 Age-related nuclear cataract, left eye: Secondary | ICD-10-CM | POA: Diagnosis not present

## 2021-06-28 DIAGNOSIS — H25012 Cortical age-related cataract, left eye: Secondary | ICD-10-CM | POA: Diagnosis not present

## 2021-07-04 DIAGNOSIS — H2512 Age-related nuclear cataract, left eye: Secondary | ICD-10-CM | POA: Diagnosis not present

## 2021-07-04 DIAGNOSIS — H25812 Combined forms of age-related cataract, left eye: Secondary | ICD-10-CM | POA: Diagnosis not present

## 2021-07-04 DIAGNOSIS — H25012 Cortical age-related cataract, left eye: Secondary | ICD-10-CM | POA: Diagnosis not present

## 2021-08-18 DIAGNOSIS — J101 Influenza due to other identified influenza virus with other respiratory manifestations: Secondary | ICD-10-CM | POA: Diagnosis not present

## 2021-08-18 DIAGNOSIS — R051 Acute cough: Secondary | ICD-10-CM | POA: Diagnosis not present

## 2021-08-28 DIAGNOSIS — J209 Acute bronchitis, unspecified: Secondary | ICD-10-CM | POA: Diagnosis not present

## 2021-08-28 DIAGNOSIS — R059 Cough, unspecified: Secondary | ICD-10-CM | POA: Diagnosis not present

## 2021-09-20 DIAGNOSIS — H6983 Other specified disorders of Eustachian tube, bilateral: Secondary | ICD-10-CM | POA: Diagnosis not present

## 2021-09-20 DIAGNOSIS — J019 Acute sinusitis, unspecified: Secondary | ICD-10-CM | POA: Diagnosis not present

## 2021-09-20 DIAGNOSIS — B9689 Other specified bacterial agents as the cause of diseases classified elsewhere: Secondary | ICD-10-CM | POA: Diagnosis not present

## 2021-09-20 DIAGNOSIS — Z7689 Persons encountering health services in other specified circumstances: Secondary | ICD-10-CM | POA: Diagnosis not present

## 2021-11-13 ENCOUNTER — Other Ambulatory Visit: Payer: BLUE CROSS/BLUE SHIELD

## 2021-12-07 DIAGNOSIS — U071 COVID-19: Secondary | ICD-10-CM | POA: Diagnosis not present

## 2021-12-11 DIAGNOSIS — S61210A Laceration without foreign body of right index finger without damage to nail, initial encounter: Secondary | ICD-10-CM | POA: Diagnosis not present

## 2021-12-11 DIAGNOSIS — S61011A Laceration without foreign body of right thumb without damage to nail, initial encounter: Secondary | ICD-10-CM | POA: Diagnosis not present

## 2022-01-08 DIAGNOSIS — U071 COVID-19: Secondary | ICD-10-CM | POA: Diagnosis not present

## 2022-01-11 DIAGNOSIS — H9203 Otalgia, bilateral: Secondary | ICD-10-CM | POA: Diagnosis not present

## 2022-01-12 DIAGNOSIS — M25562 Pain in left knee: Secondary | ICD-10-CM | POA: Diagnosis not present

## 2022-01-22 DIAGNOSIS — D485 Neoplasm of uncertain behavior of skin: Secondary | ICD-10-CM | POA: Diagnosis not present

## 2022-01-22 DIAGNOSIS — L538 Other specified erythematous conditions: Secondary | ICD-10-CM | POA: Diagnosis not present

## 2022-01-22 DIAGNOSIS — R208 Other disturbances of skin sensation: Secondary | ICD-10-CM | POA: Diagnosis not present

## 2022-01-22 DIAGNOSIS — L821 Other seborrheic keratosis: Secondary | ICD-10-CM | POA: Diagnosis not present

## 2022-01-22 DIAGNOSIS — L570 Actinic keratosis: Secondary | ICD-10-CM | POA: Diagnosis not present

## 2022-01-23 DIAGNOSIS — B9689 Other specified bacterial agents as the cause of diseases classified elsewhere: Secondary | ICD-10-CM | POA: Diagnosis not present

## 2022-01-23 DIAGNOSIS — J019 Acute sinusitis, unspecified: Secondary | ICD-10-CM | POA: Diagnosis not present

## 2022-02-02 DIAGNOSIS — U071 COVID-19: Secondary | ICD-10-CM | POA: Diagnosis not present

## 2022-02-12 DIAGNOSIS — H0102A Squamous blepharitis right eye, upper and lower eyelids: Secondary | ICD-10-CM | POA: Diagnosis not present

## 2022-02-12 DIAGNOSIS — H0102B Squamous blepharitis left eye, upper and lower eyelids: Secondary | ICD-10-CM | POA: Diagnosis not present

## 2022-02-12 DIAGNOSIS — H43811 Vitreous degeneration, right eye: Secondary | ICD-10-CM | POA: Diagnosis not present

## 2022-02-12 DIAGNOSIS — H26493 Other secondary cataract, bilateral: Secondary | ICD-10-CM | POA: Diagnosis not present

## 2022-03-09 DIAGNOSIS — G3184 Mild cognitive impairment, so stated: Secondary | ICD-10-CM | POA: Diagnosis not present

## 2022-03-09 DIAGNOSIS — Z77120 Contact with and (suspected) exposure to mold (toxic): Secondary | ICD-10-CM | POA: Diagnosis not present

## 2022-03-20 DIAGNOSIS — K5900 Constipation, unspecified: Secondary | ICD-10-CM | POA: Diagnosis not present

## 2022-03-20 DIAGNOSIS — R109 Unspecified abdominal pain: Secondary | ICD-10-CM | POA: Diagnosis not present

## 2022-03-20 DIAGNOSIS — R141 Gas pain: Secondary | ICD-10-CM | POA: Diagnosis not present

## 2022-03-20 DIAGNOSIS — R140 Abdominal distension (gaseous): Secondary | ICD-10-CM | POA: Diagnosis not present

## 2022-03-20 DIAGNOSIS — R197 Diarrhea, unspecified: Secondary | ICD-10-CM | POA: Diagnosis not present

## 2022-03-20 DIAGNOSIS — K580 Irritable bowel syndrome with diarrhea: Secondary | ICD-10-CM | POA: Diagnosis not present

## 2022-04-02 ENCOUNTER — Ambulatory Visit
Admission: RE | Admit: 2022-04-02 | Discharge: 2022-04-02 | Disposition: A | Discharge status: Home / Self Care | Payer: Medicare Other | Source: Ambulatory Visit | Attending: Obstetrics and Gynecology | Admitting: Obstetrics and Gynecology

## 2022-04-02 DIAGNOSIS — M8580 Other specified disorders of bone density and structure, unspecified site: Secondary | ICD-10-CM

## 2022-04-02 DIAGNOSIS — M8589 Other specified disorders of bone density and structure, multiple sites: Secondary | ICD-10-CM | POA: Diagnosis not present

## 2022-04-02 DIAGNOSIS — Z780 Asymptomatic menopausal state: Secondary | ICD-10-CM | POA: Diagnosis not present

## 2022-04-26 DIAGNOSIS — R0981 Nasal congestion: Secondary | ICD-10-CM | POA: Diagnosis not present

## 2022-04-26 DIAGNOSIS — J029 Acute pharyngitis, unspecified: Secondary | ICD-10-CM | POA: Diagnosis not present

## 2022-04-26 DIAGNOSIS — J069 Acute upper respiratory infection, unspecified: Secondary | ICD-10-CM | POA: Diagnosis not present

## 2022-04-26 DIAGNOSIS — H9201 Otalgia, right ear: Secondary | ICD-10-CM | POA: Diagnosis not present

## 2022-05-16 DIAGNOSIS — Z01419 Encounter for gynecological examination (general) (routine) without abnormal findings: Secondary | ICD-10-CM | POA: Diagnosis not present

## 2022-06-20 DIAGNOSIS — Z1231 Encounter for screening mammogram for malignant neoplasm of breast: Secondary | ICD-10-CM | POA: Diagnosis not present

## 2022-09-04 DIAGNOSIS — R300 Dysuria: Secondary | ICD-10-CM | POA: Diagnosis not present

## 2022-09-04 DIAGNOSIS — N760 Acute vaginitis: Secondary | ICD-10-CM | POA: Diagnosis not present

## 2022-09-04 DIAGNOSIS — N390 Urinary tract infection, site not specified: Secondary | ICD-10-CM | POA: Diagnosis not present

## 2022-09-19 DIAGNOSIS — R300 Dysuria: Secondary | ICD-10-CM | POA: Diagnosis not present

## 2022-09-27 DIAGNOSIS — K529 Noninfective gastroenteritis and colitis, unspecified: Secondary | ICD-10-CM | POA: Diagnosis not present

## 2022-09-27 DIAGNOSIS — R194 Change in bowel habit: Secondary | ICD-10-CM | POA: Diagnosis not present

## 2022-11-12 DIAGNOSIS — H26493 Other secondary cataract, bilateral: Secondary | ICD-10-CM | POA: Diagnosis not present

## 2022-11-12 DIAGNOSIS — H26491 Other secondary cataract, right eye: Secondary | ICD-10-CM | POA: Diagnosis not present

## 2022-11-28 DIAGNOSIS — H26492 Other secondary cataract, left eye: Secondary | ICD-10-CM | POA: Diagnosis not present

## 2022-12-05 DIAGNOSIS — L570 Actinic keratosis: Secondary | ICD-10-CM | POA: Diagnosis not present

## 2022-12-05 DIAGNOSIS — D225 Melanocytic nevi of trunk: Secondary | ICD-10-CM | POA: Diagnosis not present

## 2022-12-05 DIAGNOSIS — Z411 Encounter for cosmetic surgery: Secondary | ICD-10-CM | POA: Diagnosis not present

## 2022-12-05 DIAGNOSIS — D1800 Hemangioma unspecified site: Secondary | ICD-10-CM | POA: Diagnosis not present

## 2022-12-05 DIAGNOSIS — E28310 Symptomatic premature menopause: Secondary | ICD-10-CM | POA: Diagnosis not present

## 2022-12-05 DIAGNOSIS — L820 Inflamed seborrheic keratosis: Secondary | ICD-10-CM | POA: Diagnosis not present

## 2022-12-05 DIAGNOSIS — L821 Other seborrheic keratosis: Secondary | ICD-10-CM | POA: Diagnosis not present

## 2022-12-05 DIAGNOSIS — D2271 Melanocytic nevi of right lower limb, including hip: Secondary | ICD-10-CM | POA: Diagnosis not present

## 2022-12-05 DIAGNOSIS — L578 Other skin changes due to chronic exposure to nonionizing radiation: Secondary | ICD-10-CM | POA: Diagnosis not present

## 2022-12-05 DIAGNOSIS — L601 Onycholysis: Secondary | ICD-10-CM | POA: Diagnosis not present

## 2023-01-17 DIAGNOSIS — R87616 Satisfactory cervical smear but lacking transformation zone: Secondary | ICD-10-CM | POA: Diagnosis not present

## 2023-01-17 DIAGNOSIS — N390 Urinary tract infection, site not specified: Secondary | ICD-10-CM | POA: Diagnosis not present

## 2023-01-17 DIAGNOSIS — N949 Unspecified condition associated with female genital organs and menstrual cycle: Secondary | ICD-10-CM | POA: Diagnosis not present

## 2023-01-17 DIAGNOSIS — Z7689 Persons encountering health services in other specified circumstances: Secondary | ICD-10-CM | POA: Diagnosis not present

## 2023-01-17 DIAGNOSIS — B9689 Other specified bacterial agents as the cause of diseases classified elsewhere: Secondary | ICD-10-CM | POA: Diagnosis not present

## 2023-01-17 DIAGNOSIS — N760 Acute vaginitis: Secondary | ICD-10-CM | POA: Diagnosis not present

## 2023-01-23 DIAGNOSIS — N898 Other specified noninflammatory disorders of vagina: Secondary | ICD-10-CM | POA: Diagnosis not present

## 2023-01-23 DIAGNOSIS — N3000 Acute cystitis without hematuria: Secondary | ICD-10-CM | POA: Diagnosis not present

## 2023-01-23 DIAGNOSIS — N390 Urinary tract infection, site not specified: Secondary | ICD-10-CM | POA: Diagnosis not present

## 2023-01-23 DIAGNOSIS — B9689 Other specified bacterial agents as the cause of diseases classified elsewhere: Secondary | ICD-10-CM | POA: Diagnosis not present

## 2023-01-23 DIAGNOSIS — N760 Acute vaginitis: Secondary | ICD-10-CM | POA: Diagnosis not present

## 2023-01-29 DIAGNOSIS — N898 Other specified noninflammatory disorders of vagina: Secondary | ICD-10-CM | POA: Diagnosis not present

## 2023-02-11 DIAGNOSIS — G4733 Obstructive sleep apnea (adult) (pediatric): Secondary | ICD-10-CM | POA: Diagnosis not present

## 2023-03-05 DIAGNOSIS — G4710 Hypersomnia, unspecified: Secondary | ICD-10-CM | POA: Diagnosis not present

## 2023-03-27 DIAGNOSIS — A6920 Lyme disease, unspecified: Secondary | ICD-10-CM | POA: Diagnosis not present

## 2023-04-02 DIAGNOSIS — F4322 Adjustment disorder with anxiety: Secondary | ICD-10-CM | POA: Diagnosis not present

## 2023-04-10 DIAGNOSIS — F4322 Adjustment disorder with anxiety: Secondary | ICD-10-CM | POA: Diagnosis not present

## 2023-04-24 DIAGNOSIS — D2271 Melanocytic nevi of right lower limb, including hip: Secondary | ICD-10-CM | POA: Diagnosis not present

## 2023-04-24 DIAGNOSIS — D214 Benign neoplasm of connective and other soft tissue of abdomen: Secondary | ICD-10-CM | POA: Diagnosis not present

## 2023-04-25 DIAGNOSIS — H52203 Unspecified astigmatism, bilateral: Secondary | ICD-10-CM | POA: Diagnosis not present

## 2023-04-25 DIAGNOSIS — H43811 Vitreous degeneration, right eye: Secondary | ICD-10-CM | POA: Diagnosis not present

## 2023-04-25 DIAGNOSIS — H43393 Other vitreous opacities, bilateral: Secondary | ICD-10-CM | POA: Diagnosis not present

## 2023-04-25 DIAGNOSIS — H5319 Other subjective visual disturbances: Secondary | ICD-10-CM | POA: Diagnosis not present

## 2023-05-27 DIAGNOSIS — R300 Dysuria: Secondary | ICD-10-CM | POA: Diagnosis not present

## 2023-06-14 DIAGNOSIS — E28310 Symptomatic premature menopause: Secondary | ICD-10-CM | POA: Diagnosis not present

## 2023-06-26 DIAGNOSIS — Z124 Encounter for screening for malignant neoplasm of cervix: Secondary | ICD-10-CM | POA: Diagnosis not present

## 2023-06-26 DIAGNOSIS — Z1231 Encounter for screening mammogram for malignant neoplasm of breast: Secondary | ICD-10-CM | POA: Diagnosis not present

## 2023-07-15 DIAGNOSIS — F4322 Adjustment disorder with anxiety: Secondary | ICD-10-CM | POA: Diagnosis not present

## 2023-08-15 DIAGNOSIS — F4322 Adjustment disorder with anxiety: Secondary | ICD-10-CM | POA: Diagnosis not present

## 2023-08-20 DIAGNOSIS — F4322 Adjustment disorder with anxiety: Secondary | ICD-10-CM | POA: Diagnosis not present

## 2023-08-22 ENCOUNTER — Other Ambulatory Visit: Payer: Self-pay

## 2023-08-22 ENCOUNTER — Encounter: Payer: Self-pay | Admitting: Internal Medicine

## 2023-08-22 ENCOUNTER — Ambulatory Visit (INDEPENDENT_AMBULATORY_CARE_PROVIDER_SITE_OTHER): Payer: Medicare Other | Admitting: Internal Medicine

## 2023-08-22 VITALS — BP 102/67 | HR 81 | Resp 16 | Ht 65.0 in | Wt 127.0 lb

## 2023-08-22 DIAGNOSIS — R768 Other specified abnormal immunological findings in serum: Secondary | ICD-10-CM | POA: Diagnosis not present

## 2023-08-22 NOTE — Progress Notes (Signed)
RFV: lyme disease  Patient ID: Michelle Nolan, female   DOB: Jan 25, 1956, 67 y.o.   MRN: [id number]  HPI Michelle Nolan is a 67yo F who had lyme testing in June 2024. Only 1 band positive. Testing for IGM negative and negative for IGG, also only 1 band positive and IND to band 23. All others were negative but borrelia immunoblot IgG was positive. Babesia is negative. 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  5 years ago, --started to have sore throat, and went to functional medicine doctor in town -- where she did telomere and mold studies.   Found to need vapor barrier to house.  Now sees a doctor in [place]? For functional medicine -where he did all the tickborne and viral testing.  Has history of tick bites in the past --especially in childhood and adult but doesn't recall  Has more fatigue and brain fog than she should have. My inflamation numbers are through the roof, and brain health -- 18% percentile...since she continues to do diet, exercise, and.  Taking herbal  supplements for anaplasmosis from [facility].  Outpatient Encounter Medications as of 08/22/2023  Medication Sig   Calcium Carbonate-Vit D-Min (CALCIUM 1200 PO) Take 1 tablet by mouth.   escitalopram (LEXAPRO) 10 MG tablet escitalopram 10 mg tablet  Take 1 tablet every day by oral route.   FISH OIL-COENZYME Q10 PO Take by mouth.   progesterone (PROMETRIUM) 100 MG capsule Take 100 mg by mouth daily.   VITAMIN D, CHOLECALCIFEROL, PO Take 1 tablet by mouth daily.   No facility-administered encounter medications on file as of 08/22/2023.     Patient Active Problem List   Diagnosis Date Noted   OSA (obstructive sleep apnea) 01/26/2020   Shortness of breath 12/25/2016   GERD (gastroesophageal reflux disease) 12/25/2016   Asthma 08/09/2011   HYPOTHYROIDISM 01/18/2009   Cough 01/18/2009     Health Maintenance Due  Topic Date Due   Medicare Annual  Wellness (AWV)  Never done   Hepatitis C Screening  Never done   DTaP/Tdap/Td (1 - Tdap) Never done   Colonoscopy  Never done   Zoster Vaccines- Shingrix (1 of 2) Never done   Pneumonia Vaccine 55+ Years old (2 of 2 - PPSV23 or PCV20) 11/12/2020   MAMMOGRAM  04/20/2023   INFLUENZA VACCINE  05/16/2023   COVID-19 Vaccine (1 - 2023-24 season) Never done     Review of Systems 12 point ros is negative other than what is mentioned above Physical Exam  BP 102/67   Pulse 81   Resp 16   Ht 5\' 5"  (1.651 m)   Wt 127 lb (57.6 kg)   BMI 21.13 kg/m  Physical Exam  Constitutional:  oriented to person, place, and time. appears well-developed and well-nourished. No distress.  HENT: [place]/AT, PERRLA, no scleral icterus Mouth/Throat: Oropharynx is clear and moist. No oropharyngeal exudate.  Cardiovascular: Normal rate, regular rhythm and normal heart sounds. Exam reveals no gallop and no friction rub.  No murmur heard.  Pulmonary/Chest: Effort normal and breath sounds normal. No respiratory distress.  has no wheezes.  Neck = supple, no nuchal rigidity Lymphadenopathy: no cervical adenopathy. No axillary adenopathy Neurological: alert and oriented to person, place, and time.  Skin: Skin is warm and dry. No rash noted. No erythema.  Psychiatric: a normal mood and affect.  behavior is normal.    CBC Lab Results  Component Value Date  WBC 6.5 01/19/2021   RBC 3.97 01/19/2021   HGB 12.2 01/19/2021   HCT 37.1 01/19/2021   PLT 234 01/19/2021   MCV 93.5 01/19/2021   MCH 30.7 01/19/2021   MCHC 32.9 01/19/2021   RDW 12.5 01/19/2021   LYMPHSABS 1.9 01/19/2021   MONOABS 0.7 01/19/2021   EOSABS 0.3 01/19/2021    BMET Lab Results  Component Value Date   NA 136 01/19/2021   K 4.4 01/19/2021   CL 103 01/19/2021   CO2 26 01/19/2021   GLUCOSE 89 01/19/2021   BUN 18 01/19/2021   CREATININE 0.73 01/19/2021   CALCIUM 9.4 01/19/2021   GFRNONAA >60 01/19/2021      Assessment and Plan No  recent tick exposure or acute symptoms to suggests needing treatment for anaplasmosis. Reviewed labs results with patient.  I have personally spent 30 minutes involved in face-to-face and non-face-to-face activities for this patient on the day of the visit. Professional time spent includes the following activities: Preparing to see the patient (review of tests), Obtaining and/or reviewing separately obtained history (admission/discharge record), Performing a medically appropriate examination and/or evaluation , Ordering medications/tests/procedures, referring and communicating with other health care professionals, Documenting clinical information in the EMR, Independently interpreting results (not separately reported), Communicating results to the patient/family/caregiver, Counseling and educating the patient/family/caregiver and Care coordination (not separately reported).

## 2023-08-26 ENCOUNTER — Other Ambulatory Visit (HOSPITAL_BASED_OUTPATIENT_CLINIC_OR_DEPARTMENT_OTHER): Payer: Self-pay | Admitting: Obstetrics and Gynecology

## 2023-08-26 DIAGNOSIS — Z139 Encounter for screening, unspecified: Secondary | ICD-10-CM

## 2023-08-28 DIAGNOSIS — F4322 Adjustment disorder with anxiety: Secondary | ICD-10-CM | POA: Diagnosis not present

## 2023-09-30 ENCOUNTER — Telehealth (HOSPITAL_BASED_OUTPATIENT_CLINIC_OR_DEPARTMENT_OTHER): Payer: Self-pay | Admitting: Cardiology

## 2023-09-30 NOTE — Telephone Encounter (Signed)
Patient c/o Palpitations:  STAT if patient reporting lightheadedness, shortness of breath, or chest pain  How long have you had palpitations/irregular HR/ Afib? Are you having the symptoms now?   No  Are you currently experiencing lightheadedness, SOB or CP?   No  Do you have a history of afib (atrial fibrillation) or irregular heart rhythm?   No  Have you checked your BP or HR? (document readings if available):   120/80  HR  87 (while sitting)  Are you experiencing any other symptoms?  No  Patient stated she had these symptoms last night and is concerned as she is travelling back from [place].  Patient noted her BP has been trending high (today 120/80). Patient stated her irregular heart beat occurred mostly when exercising but lately it has been happening more often.  Patient noted she has also had "a shocking sensation down her both arms".

## 2023-09-30 NOTE — Telephone Encounter (Signed)
Spoke with patient regarding palpitations They has been having increased palpitations over the last several months which are worse with exercise On treadmill yesterday HR went up into the 190's  Has been under more stress lately  At night when she goes to bed she can feel her heart pounding Blood pressure has always been around 96/60 and now running 120/80 She is concerned and would like to see about getting a sooner appointment   Patient has not been seen by Dr Cristal Deer in over 3 years so she can see another provider   Scheduled her an appointment with Dr Bjorn Pippin for 1/9  Advised patient and sent mychart message  Also have sent message to have her added to cancellation list

## 2023-10-20 NOTE — Progress Notes (Signed)
 Cardiology Office Note:    Date:  10/24/2023   ID:  Michelle Nolan, Michelle Nolan 1956-09-14, MRN [id number]  PCP:  Lilton Legions, DO  Cardiologist:  Shelda Bruckner, MD  Electrophysiologist:  None   Referring MD: Lilton Legions, DO   Chief Complaint  Patient presents with   Palpitations    History of Present Illness:    Michelle Nolan is a 68 y.o. female with a hx of hypothyroidism who is referred by Dr. Lilton for evaluation of palpitations.  Previously followed with Dr. Bruckner, last seen 06/2020.  Had been referred for chest pain and underwent coronary CTA on 07/14/2020, which showed normal coronary arteries, calcium score 0.  She reports she has been having sudden palpitations, will last for few seconds.  States it feels like pounding in chest and  will radiate down both arms.  Happens 1-2 times per week.  States that happen once with monitoring heart rate on treadmill and heart rate went up to 220 bpm.  She was previously on thyroid  medication but reports has been off medication and her recent TSH was normal.  She is very active, denies any exertional symptoms.  She denies any chest pain, dyspnea, lightheadedness, syncope.  Does report some lower extremity edema.  She smoked as a teenager.  Family history includes father died of CVA in 43s and sister had CVA at 32.   Past Medical History:  Diagnosis Date   Chondromalacia of right patella    Hypothyroidism     Past Surgical History:  Procedure Laterality Date   AUGMENTATION MAMMAPLASTY Bilateral    removed 11/2020   BREAST ENHANCEMENT SURGERY Bilateral    CESAREAN SECTION     x2   KNEE ARTHROSCOPY Left    KNEE ARTHROSCOPY WITH LATERAL MENISECTOMY Right 03/17/2015   Procedure: KNEE ARTHROSCOPY WITH PARTIAL LATERAL MENISECTOMY;  Surgeon: Toribio Chancy, MD;  Location: [facility];  Service: Orthopedics;  Laterality: Right;   KNEE ARTHROSCOPY WITH MEDIAL MENISECTOMY Right 03/17/2015   Procedure: RIGHT  KNEE ARTHROSCOPY CHONDROPLASTY WITH MEDIAL MENISCECTOMY;  Surgeon: Toribio Chancy, MD;  Location: [facility];  Service: Orthopedics;  Laterality: Right;   WRIST SURGERY Left     Current Medications: Current Meds  Medication Sig   Calcium Carbonate-Vit D-Min (CALCIUM 1200 PO) Take 1 tablet by mouth.   progesterone  (PROMETRIUM ) 100 MG capsule Take 100 mg by mouth daily.   VITAMIN D, CHOLECALCIFEROL, PO Take 1 tablet by mouth daily.     Allergies:   Codeine and Sulfonamide derivatives   Social History   Socioeconomic History   Marital status: Married    Spouse name: Not on file   Number of children: Not on file   Years of education: Not on file   Highest education level: Not on file  Occupational History   Not on file  Tobacco Use   Smoking status: Former    Current packs/day: 0.00    Average packs/day: 0.3 packs/day for 5.0 years (1.5 ttl pk-yrs)    Types: Cigarettes    Start date: 10/15/1973    Quit date: 10/15/1978    Years since quitting: 45.0   Smokeless tobacco: Never  Substance and Sexual Activity   Alcohol use: Yes    Comment: social   Drug use: No   Sexual activity: Yes  Other Topics Concern   Not on file  Social History Narrative   Not on file   Social Drivers of Health   Financial Resource Strain: Not on  file  Food Insecurity: Not on file  Transportation Needs: Not on file  Physical Activity: Not on file  Stress: Not on file  Social Connections: Unknown (02/25/2022)   Received from [facility]   Social Network    Social Network: Not on file     Family History: The patient's family history includes Breast cancer in her mother; COPD in her father.  ROS:   Please see the history of present illness.     All other systems reviewed and are negative.  EKGs/Labs/Other Studies Reviewed:    The following studies were reviewed today:   EKG:   10/24/2023: Normal sinus rhythm, rate 60  Recent Labs: No results found for  requested labs within last 365 days.  Recent Lipid Panel No results found for: CHOL, TRIG, HDL, CHOLHDL, VLDL, LDLCALC, LDLDIRECT  Physical Exam:    VS:  BP 123/83 (BP Location: Left Arm, Patient Position: Sitting, Cuff Size: Normal)   Pulse 60   Ht 5' 4 (1.626 m)   Wt 131 lb (59.4 kg)   SpO2 92%   BMI 22.49 kg/m     Wt Readings from Last 3 Encounters:  10/24/23 131 lb (59.4 kg)  08/22/23 127 lb (57.6 kg)  01/19/21 120 lb (54.4 kg)     GEN:  Well nourished, well developed in no acute distress HEENT: Normal NECK: No JVD; No carotid bruits LYMPHATICS: No lymphadenopathy CARDIAC: RRR, no murmurs, rubs, gallops RESPIRATORY:  Clear to auscultation without rales, wheezing or rhonchi  ABDOMEN: Soft, non-tender, non-distended MUSCULOSKELETAL:  No edema; No deformity  SKIN: Warm and dry NEUROLOGIC:  Alert and oriented x 3 PSYCHIATRIC:  Normal affect   ASSESSMENT:    1. Palpitations   2. Chest pain of uncertain etiology    PLAN:    Palpitations: Description concerning for arrhythmia, evaluate with Zio patch x 2 weeks.  Check echocardiogram to rule out structural heart disease  Chest pain: underwent coronary CTA on 07/14/2020, which showed normal coronary arteries, calcium score 0.  RTC as needed    Medication Adjustments/Labs and Tests Ordered: Current medicines are reviewed at length with the patient today.  Concerns regarding medicines are outlined above.  Orders Placed This Encounter  Procedures   LONG TERM MONITOR (3-14 DAYS)   EKG 12-Lead   ECHOCARDIOGRAM COMPLETE   No orders of the defined types were placed in this encounter.   Patient Instructions  Medication Instructions:  Continue current medications *If you need a refill on your cardiac medications before your next appointment, please call your pharmacy*   Lab Work: none If you have labs (blood work) drawn today and your tests are completely normal, you will receive your results only  by: MyChart Message (if you have MyChart) OR A paper copy in the mail If you have any lab test that is abnormal or we need to change your treatment, we will call you to review the results.   Testing/Procedures: ECHO Your physician has requested that you have an echocardiogram. Echocardiography is a painless test that uses sound waves to create images of your heart. It provides your doctor with information about the size and shape of your heart and how well your heart's chambers and valves are working. This procedure takes approximately one hour. There are no restrictions for this procedure. Please do NOT wear cologne, perfume, aftershave, or lotions (deodorant is allowed). Please arrive 15 minutes prior to your appointment time.  Please note: We ask at that you not bring children  with you during ultrasound (echo/ vascular) testing. Due to room size and safety concerns, children are not allowed in the ultrasound rooms during exams. Our front office staff cannot provide observation of children in our lobby area while testing is being conducted. An adult accompanying a patient to their appointment will only be allowed in the ultrasound room at the discretion of the ultrasound technician under special circumstances. We apologize for any inconvenience.   And   Zio  ZIO XT- Long Term Monitor Instructions  Your physician has requested you wear a ZIO patch monitor for 14 days.  This is a single patch monitor. [employer] supplies one patch monitor per enrollment. Additional stickers are not available. Please do not apply patch if you will be having a Nuclear Stress Test,  Echocardiogram, Cardiac CT, MRI, or Chest Xray during the period you would be wearing the  monitor. The patch cannot be worn during these tests. You cannot remove and re-apply the  ZIO XT patch monitor.  Your ZIO patch monitor will be mailed 3 day USPS to your address on file. It may take 3-5 days  to receive your monitor after you  have been enrolled.  Once you have received your monitor, please review the enclosed instructions. Your monitor  has already been registered assigning a specific monitor serial # to you.  Billing and Patient Assistance Program Information  We have supplied [employer] with any of your insurance information on file for billing purposes. [employer] offers a sliding scale Patient Assistance Program for patients that do not have  insurance, or whose insurance does not completely cover the cost of the ZIO monitor.  You must apply for the Patient Assistance Program to qualify for this discounted rate.  To apply, please call [employer] at [phone], select option 4, select option 2, ask to apply for  Patient Assistance Program. Meredeth will ask your household income, and how many people  are in your household. They will quote your out-of-pocket cost based on that information.  [employer] will also be able to set up a 51-month, interest-free payment plan if needed.  Applying the monitor   Shave hair from upper left chest.  Hold abrader disc by orange tab. Rub abrader in 40 strokes over the upper left chest as  indicated in your monitor instructions.  Clean area with 4 enclosed alcohol pads. Let dry.  Apply patch as indicated in monitor instructions. Patch will be placed under collarbone on left  side of chest with arrow pointing upward.  Rub patch adhesive wings for 2 minutes. Remove white label marked 1. Remove the white  label marked 2. Rub patch adhesive wings for 2 additional minutes.  While looking in a mirror, press and release button in center of patch. A small green light will  flash 3-4 times. This will be your only indicator that the monitor has been turned on.  Do not shower for the first 24 hours. You may shower after the first 24 hours.  Press the button if you feel a symptom. You will hear a small click. Record Date, Time and  Symptom in the Patient Logbook.  When you are ready to  remove the patch, follow instructions on the last 2 pages of Patient  Logbook. Stick patch monitor onto the last page of Patient Logbook.  Place Patient Logbook in the blue and white box. Use locking tab on box and tape box closed  securely. The blue and white box has prepaid postage on it. Please place it  in the mailbox as  soon as possible. Your physician should have your test results approximately 7 days after the  monitor has been mailed back to [facility].  Call [facility] Customer Care at [phone] if you have questions regarding  your ZIO XT patch monitor. Call them immediately if you see an orange light blinking on your  monitor.  If your monitor falls off in less than 4 days, contact our Monitor department at [phone].  If your monitor becomes loose or falls off after 4 days call [employer] at [phone] for  suggestions on securing your monitor    Follow-Up: At [facility], you and your health needs are our priority.  As part of our continuing mission to provide you with exceptional heart care, we have created designated Provider Care Teams.  These Care Teams include your primary Cardiologist (physician) and Advanced Practice Providers (APPs -  Physician Assistants and Nurse Practitioners) who all work together to provide you with the care you need, when you need it.  We recommend signing up for the patient portal called MyChart.  Sign up information is provided on this After Visit Summary.  MyChart is used to connect with patients for Virtual Visits (Telemedicine).  Patients are able to view lab/test results, encounter notes, upcoming appointments, etc.  Non-urgent messages can be sent to your provider as well.   To learn more about what you can do with MyChart, go to [email or web address].    Your next appointment:   As needed  Provider:   Dr. Kate  Other Instructions none        Signed, Lonni LITTIE Kate, MD  10/24/2023 12:53 PM     [facility]

## 2023-10-21 ENCOUNTER — Ambulatory Visit (HOSPITAL_BASED_OUTPATIENT_CLINIC_OR_DEPARTMENT_OTHER)
Admission: RE | Admit: 2023-10-21 | Discharge: 2023-10-21 | Disposition: A | Discharge status: Home / Self Care | Payer: Self-pay | Source: Ambulatory Visit | Attending: Obstetrics and Gynecology | Admitting: Obstetrics and Gynecology

## 2023-10-21 DIAGNOSIS — Z139 Encounter for screening, unspecified: Secondary | ICD-10-CM | POA: Insufficient documentation

## 2023-10-22 DIAGNOSIS — R300 Dysuria: Secondary | ICD-10-CM | POA: Diagnosis not present

## 2023-10-24 ENCOUNTER — Ambulatory Visit: Payer: Medicare Other | Attending: Cardiology

## 2023-10-24 ENCOUNTER — Encounter: Payer: Self-pay | Admitting: Cardiology

## 2023-10-24 ENCOUNTER — Ambulatory Visit: Payer: Medicare Other | Admitting: Cardiology

## 2023-10-24 ENCOUNTER — Ambulatory Visit: Payer: Medicare Other | Attending: Cardiology | Admitting: Cardiology

## 2023-10-24 VITALS — BP 123/83 | HR 60 | Ht 64.0 in | Wt 131.0 lb

## 2023-10-24 DIAGNOSIS — R079 Chest pain, unspecified: Secondary | ICD-10-CM | POA: Insufficient documentation

## 2023-10-24 DIAGNOSIS — R002 Palpitations: Secondary | ICD-10-CM | POA: Insufficient documentation

## 2023-10-24 NOTE — Progress Notes (Unsigned)
 Enrolled patient for a 14 day Zio XT  monitor to be mailed to patients home

## 2023-10-24 NOTE — Patient Instructions (Signed)
 Medication Instructions:  Continue current medications *If you need a refill on your cardiac medications before your next appointment, please call your pharmacy*   Lab Work: none If you have labs (blood work) drawn today and your tests are completely normal, you will receive your results only by: MyChart Message (if you have MyChart) OR A paper copy in the mail If you have any lab test that is abnormal or we need to change your treatment, we will call you to review the results.   Testing/Procedures: ECHO Your physician has requested that you have an echocardiogram. Echocardiography is a painless test that uses sound waves to create images of your heart. It provides your doctor with information about the size and shape of your heart and how well your heart's chambers and valves are working. This procedure takes approximately one hour. There are no restrictions for this procedure. Please do NOT wear cologne, perfume, aftershave, or lotions (deodorant is allowed). Please arrive 15 minutes prior to your appointment time.  Please note: We ask at that you not bring children with you during ultrasound (echo/ vascular) testing. Due to room size and safety concerns, children are not allowed in the ultrasound rooms during exams. Our front office staff cannot provide observation of children in our lobby area while testing is being conducted. An adult accompanying a patient to their appointment will only be allowed in the ultrasound room at the discretion of the ultrasound technician under special circumstances. We apologize for any inconvenience.   And   Zio  ZIO XT- Long Term Monitor Instructions  Your physician has requested you wear a ZIO patch monitor for 14 days.  This is a single patch monitor. [employer] supplies one patch monitor per enrollment. Additional stickers are not available. Please do not apply patch if you will be having a Nuclear Stress Test,  Echocardiogram, Cardiac CT, MRI, or  Chest Xray during the period you would be wearing the  monitor. The patch cannot be worn during these tests. You cannot remove and re-apply the  ZIO XT patch monitor.  Your ZIO patch monitor will be mailed 3 day USPS to your address on file. It may take 3-5 days  to receive your monitor after you have been enrolled.  Once you have received your monitor, please review the enclosed instructions. Your monitor  has already been registered assigning a specific monitor serial # to you.  Billing and Patient Assistance Program Information  We have supplied [employer] with any of your insurance information on file for billing purposes. [employer] offers a sliding scale Patient Assistance Program for patients that do not have  insurance, or whose insurance does not completely cover the cost of the ZIO monitor.  You must apply for the Patient Assistance Program to qualify for this discounted rate.  To apply, please call [employer] at [phone], select option 4, select option 2, ask to apply for  Patient Assistance Program. Meredeth will ask your household income, and how many people  are in your household. They will quote your out-of-pocket cost based on that information.  [employer] will also be able to set up a 71-month, interest-free payment plan if needed.  Applying the monitor   Shave hair from upper left chest.  Hold abrader disc by orange tab. Rub abrader in 40 strokes over the upper left chest as  indicated in your monitor instructions.  Clean area with 4 enclosed alcohol pads. Let dry.  Apply patch as indicated in monitor instructions. Patch will be placed  under collarbone on left  side of chest with arrow pointing upward.  Rub patch adhesive wings for 2 minutes. Remove white label marked 1. Remove the white  label marked 2. Rub patch adhesive wings for 2 additional minutes.  While looking in a mirror, press and release button in center of patch. A small green light will  flash 3-4 times. This  will be your only indicator that the monitor has been turned on.  Do not shower for the first 24 hours. You may shower after the first 24 hours.  Press the button if you feel a symptom. You will hear a small click. Record Date, Time and  Symptom in the Patient Logbook.  When you are ready to remove the patch, follow instructions on the last 2 pages of Patient  Logbook. Stick patch monitor onto the last page of Patient Logbook.  Place Patient Logbook in the blue and white box. Use locking tab on box and tape box closed  securely. The blue and white box has prepaid postage on it. Please place it in the mailbox as  soon as possible. Your physician should have your test results approximately 7 days after the  monitor has been mailed back to [facility].  Call [facility] Customer Care at [phone] if you have questions regarding  your ZIO XT patch monitor. Call them immediately if you see an orange light blinking on your  monitor.  If your monitor falls off in less than 4 days, contact our Monitor department at [phone].  If your monitor becomes loose or falls off after 4 days call [employer] at [phone] for  suggestions on securing your monitor    Follow-Up: At [facility], you and your health needs are our priority.  As part of our continuing mission to provide you with exceptional heart care, we have created designated Provider Care Teams.  These Care Teams include your primary Cardiologist (physician) and Advanced Practice Providers (APPs -  Physician Assistants and Nurse Practitioners) who all work together to provide you with the care you need, when you need it.  We recommend signing up for the patient portal called MyChart.  Sign up information is provided on this After Visit Summary.  MyChart is used to connect with patients for Virtual Visits (Telemedicine).  Patients are able to view lab/test results, encounter notes, upcoming appointments, etc.  Non-urgent  messages can be sent to your provider as well.   To learn more about what you can do with MyChart, go to [email or web address].    Your next appointment:   As needed  Provider:   Dr. Kate  Other Instructions none

## 2023-10-28 DIAGNOSIS — G3184 Mild cognitive impairment, so stated: Secondary | ICD-10-CM | POA: Diagnosis not present

## 2023-10-28 DIAGNOSIS — E619 Deficiency of nutrient element, unspecified: Secondary | ICD-10-CM | POA: Diagnosis not present

## 2023-10-28 DIAGNOSIS — Z77098 Contact with and (suspected) exposure to other hazardous, chiefly nonmedicinal, chemicals: Secondary | ICD-10-CM | POA: Diagnosis not present

## 2023-10-28 DIAGNOSIS — R5383 Other fatigue: Secondary | ICD-10-CM | POA: Diagnosis not present

## 2023-10-28 DIAGNOSIS — E639 Nutritional deficiency, unspecified: Secondary | ICD-10-CM | POA: Diagnosis not present

## 2023-10-28 DIAGNOSIS — A6920 Lyme disease, unspecified: Secondary | ICD-10-CM | POA: Diagnosis not present

## 2023-10-28 DIAGNOSIS — F419 Anxiety disorder, unspecified: Secondary | ICD-10-CM | POA: Diagnosis not present

## 2023-10-28 DIAGNOSIS — R5382 Chronic fatigue, unspecified: Secondary | ICD-10-CM | POA: Diagnosis not present

## 2023-10-30 DIAGNOSIS — J101 Influenza due to other identified influenza virus with other respiratory manifestations: Secondary | ICD-10-CM | POA: Diagnosis not present

## 2023-10-30 DIAGNOSIS — R52 Pain, unspecified: Secondary | ICD-10-CM | POA: Diagnosis not present

## 2023-10-31 DIAGNOSIS — L820 Inflamed seborrheic keratosis: Secondary | ICD-10-CM | POA: Diagnosis not present

## 2023-10-31 DIAGNOSIS — L814 Other melanin hyperpigmentation: Secondary | ICD-10-CM | POA: Diagnosis not present

## 2023-10-31 DIAGNOSIS — L219 Seborrheic dermatitis, unspecified: Secondary | ICD-10-CM | POA: Diagnosis not present

## 2023-11-03 DIAGNOSIS — R0602 Shortness of breath: Secondary | ICD-10-CM | POA: Diagnosis not present

## 2023-11-03 DIAGNOSIS — J159 Unspecified bacterial pneumonia: Secondary | ICD-10-CM | POA: Diagnosis not present

## 2023-11-05 DIAGNOSIS — F4322 Adjustment disorder with anxiety: Secondary | ICD-10-CM | POA: Diagnosis not present

## 2023-11-06 DIAGNOSIS — D1722 Benign lipomatous neoplasm of skin and subcutaneous tissue of left arm: Secondary | ICD-10-CM | POA: Diagnosis not present

## 2023-11-08 ENCOUNTER — Ambulatory Visit (HOSPITAL_BASED_OUTPATIENT_CLINIC_OR_DEPARTMENT_OTHER): Payer: Medicare Other | Admitting: Cardiology

## 2023-11-13 DIAGNOSIS — F4322 Adjustment disorder with anxiety: Secondary | ICD-10-CM | POA: Diagnosis not present

## 2023-11-14 ENCOUNTER — Ambulatory Visit (HOSPITAL_COMMUNITY): Payer: Medicare Other | Attending: Cardiology

## 2023-11-14 DIAGNOSIS — I361 Nonrheumatic tricuspid (valve) insufficiency: Secondary | ICD-10-CM | POA: Diagnosis not present

## 2023-11-14 DIAGNOSIS — R002 Palpitations: Secondary | ICD-10-CM | POA: Insufficient documentation

## 2023-11-14 LAB — ECHOCARDIOGRAM COMPLETE
Area-P 1/2: 3.45 cm2
S' Lateral: 2.2 cm

## 2023-11-20 DIAGNOSIS — R5382 Chronic fatigue, unspecified: Secondary | ICD-10-CM | POA: Diagnosis not present

## 2023-11-20 DIAGNOSIS — R638 Other symptoms and signs concerning food and fluid intake: Secondary | ICD-10-CM | POA: Diagnosis not present

## 2023-11-20 DIAGNOSIS — R898 Other abnormal findings in specimens from other organs, systems and tissues: Secondary | ICD-10-CM | POA: Diagnosis not present

## 2023-11-20 DIAGNOSIS — Z77098 Contact with and (suspected) exposure to other hazardous, chiefly nonmedicinal, chemicals: Secondary | ICD-10-CM | POA: Diagnosis not present

## 2023-11-20 DIAGNOSIS — M1180 Other specified crystal arthropathies, unspecified site: Secondary | ICD-10-CM | POA: Diagnosis not present

## 2023-11-20 DIAGNOSIS — R7309 Other abnormal glucose: Secondary | ICD-10-CM | POA: Diagnosis not present

## 2023-11-20 DIAGNOSIS — R0981 Nasal congestion: Secondary | ICD-10-CM | POA: Diagnosis not present

## 2023-11-20 DIAGNOSIS — Z77120 Contact with and (suspected) exposure to mold (toxic): Secondary | ICD-10-CM | POA: Diagnosis not present

## 2023-11-20 DIAGNOSIS — Z120 Encounter for screening for malignant neoplasm of stomach: Secondary | ICD-10-CM | POA: Diagnosis not present

## 2023-11-26 DIAGNOSIS — F4322 Adjustment disorder with anxiety: Secondary | ICD-10-CM | POA: Diagnosis not present

## 2023-11-27 DIAGNOSIS — F4322 Adjustment disorder with anxiety: Secondary | ICD-10-CM | POA: Diagnosis not present

## 2023-11-28 DIAGNOSIS — R002 Palpitations: Secondary | ICD-10-CM | POA: Diagnosis not present

## 2023-12-04 DIAGNOSIS — F4322 Adjustment disorder with anxiety: Secondary | ICD-10-CM | POA: Diagnosis not present

## 2023-12-11 DIAGNOSIS — R002 Palpitations: Secondary | ICD-10-CM | POA: Diagnosis not present

## 2023-12-14 DIAGNOSIS — H6691 Otitis media, unspecified, right ear: Secondary | ICD-10-CM | POA: Diagnosis not present

## 2023-12-14 DIAGNOSIS — H9201 Otalgia, right ear: Secondary | ICD-10-CM | POA: Diagnosis not present

## 2023-12-18 DIAGNOSIS — H938X1 Other specified disorders of right ear: Secondary | ICD-10-CM | POA: Diagnosis not present

## 2023-12-20 ENCOUNTER — Encounter (INDEPENDENT_AMBULATORY_CARE_PROVIDER_SITE_OTHER): Payer: Self-pay

## 2023-12-20 ENCOUNTER — Ambulatory Visit (INDEPENDENT_AMBULATORY_CARE_PROVIDER_SITE_OTHER): Admitting: Audiology

## 2023-12-20 ENCOUNTER — Ambulatory Visit (INDEPENDENT_AMBULATORY_CARE_PROVIDER_SITE_OTHER)

## 2023-12-20 VITALS — BP 113/74 | HR 85 | Ht 64.0 in | Wt 125.0 lb

## 2023-12-20 DIAGNOSIS — H9041 Sensorineural hearing loss, unilateral, right ear, with unrestricted hearing on the contralateral side: Secondary | ICD-10-CM

## 2023-12-20 DIAGNOSIS — H918X3 Other specified hearing loss, bilateral: Secondary | ICD-10-CM

## 2023-12-20 DIAGNOSIS — H9191 Unspecified hearing loss, right ear: Secondary | ICD-10-CM | POA: Diagnosis not present

## 2023-12-20 NOTE — Progress Notes (Signed)
  [address] [place], [place] [address] [phone]  Audiological Evaluation    Name: Michelle Nolan     DOB:   [birth year]      MRN:   [id number]                                                                                     Service Date: 12/20/2023     Accompanied by: unaccompanied    Patient comes today after Eyvonne Mechanic, PA-C sent a referral for a hearing evaluation due to concerns with ear fullness.   Symptoms Yes Details  Hearing loss  [x]  Perceived hearing loss in the right ear since she went to the [place].  Tinnitus  []    Ear pain/ infections/pressure  [x]  Right ear fullness  Balance problems  []    Noise exposure history  []    Previous ear surgeries  []    Family history of hearing loss  []    Amplification  []    Other  []      Otoscopy: Right ear: Clear external ear canals and notable landmarks visualized on the tympanic membrane. Left ear:  Clear external ear canals and notable landmarks visualized on the tympanic membrane.  Tympanometry: Right ear: Type A- Normal external ear canal volume with normal middle ear pressure and tympanic membrane compliance Left ear: Type A- Normal external ear canal volume with normal middle ear pressure and tympanic membrane compliance    Pure tone Audiometry: Right ear- Normal hearing from [phone] Hz, then mild presumably sensorineural hearing loss from 6000 Hz - 8000 Hz. Left ear-  Normal hearing from [id number] Hz.    Speech Audiometry: Right ear- Speech Reception Threshold (SRT) was obtained at 15 dBHL. Left ear-Speech Reception Threshold (SRT) was obtained at 15 dBHL.   Word Recognition Score Tested using NU-6 (MLV) Right ear: 92% was obtained at a presentation level of 50 dBHL with contralateral masking which is deemed as  excellent. Left ear: 96% was obtained at a presentation level of 50 dBHL with contralateral masking which is deemed as  excellent.   The hearing test results were  completed under headphones and re-checked with inserts and results are deemed to be of good reliability. Test technique:  conventional    Impression: There is a slight significant difference in pure-tone thresholds between ears, worse in the right ear.   Recommendations: Follow up with ENT as scheduled for today. Return for a hearing evaluation if concerns with hearing changes arise or per MD recommendation.   Shabnam Ladd MARIE LEROUX-MARTINEZ, AUD

## 2023-12-20 NOTE — Progress Notes (Unsigned)
 Dear Dr. Claiborne Billings, Here is my assessment for our mutual patient, Michelle Nolan. Thank you for allowing me the opportunity to care for your patient. Please do not hesitate to contact me should you have any other questions. Sincerely, Burna Forts PA-C  Otolaryngology Clinic Note Referring provider: Dr. Claiborne Billings HPI:  Michelle Nolan is a 68 y.o. female kindly referred by Dr. Claiborne Billings   The patient is a 68 year old female seen in our office for evaluation of right sided ear complaints.  The patient notes that on February 24 she was in the [place].  She notes without any associated symptoms she started to have head pressure some balance issues and right ear discomfort.  She was seen by a provider there who prescribed her otic drops with antibiotics/steroids.  She notes this did not improve her symptoms.  She followed up with an urgent care here in [place] and was placed on a azithromycin for 5 days, she notes this did not provide any significant relief of the symptoms.  She notes that her symptoms have improved at this point with some minimal fullness around the right ear, she notes that she has decreased hearing out of the right ear when compared to the left.  She notes that she does feel slightly off balance when trying to do activities at the gym.  Head or neck trauma, no infectious signs or symptoms.  No issues with hearing prior.  She also notes that she was recently diagnosed with tickborne illness and was started on doxycycline, this was prior to the ear related issues.  Independent Review of Additional Tests or Records:     PMH/Meds/All/SocHx/FamHx/ROS:   Past Medical History:  Diagnosis Date   Chondromalacia of right patella    Hypothyroidism      Past Surgical History:  Procedure Laterality Date   AUGMENTATION MAMMAPLASTY Bilateral    removed 11/2020   BREAST ENHANCEMENT SURGERY Bilateral    CESAREAN SECTION     x2   KNEE ARTHROSCOPY Left    KNEE ARTHROSCOPY WITH  LATERAL MENISECTOMY Right 03/17/2015   Procedure: KNEE ARTHROSCOPY WITH PARTIAL LATERAL MENISECTOMY;  Surgeon: Mckinley Jewel, MD;  Location: [facility];  Service: Orthopedics;  Laterality: Right;   KNEE ARTHROSCOPY WITH MEDIAL MENISECTOMY Right 03/17/2015   Procedure: RIGHT KNEE ARTHROSCOPY CHONDROPLASTY WITH MEDIAL MENISCECTOMY;  Surgeon: Mckinley Jewel, MD;  Location: [facility];  Service: Orthopedics;  Laterality: Right;   WRIST SURGERY Left     Family History  Problem Relation Age of Onset   COPD Father    Breast cancer Mother      Social Connections: Unknown (02/25/2022)   Received from [facility]   Social Network    Social Network: Not on file      Current Outpatient Medications:    Calcium Carbonate-Vit D-Min (CALCIUM 1200 PO), Take 1 tablet by mouth., Disp: , Rfl:    progesterone (PROMETRIUM) 100 MG capsule, Take 100 mg by mouth daily., Disp: , Rfl:    VITAMIN D, CHOLECALCIFEROL, PO, Take 1 tablet by mouth daily., Disp: , Rfl:    escitalopram (LEXAPRO) 10 MG tablet, escitalopram 10 mg tablet  Take 1 tablet every day by oral route., Disp: , Rfl:    FISH OIL-COENZYME Q10 PO, Take by mouth. (Patient not taking: Reported on 10/24/2023), Disp: , Rfl:    Physical Exam:   There were no vitals taken for this visit.  Pertinent Findings  CN II-XII intact *** Bilateral EAC clear and TM  intact with well pneumatized middle ear spaces Weber 512: equal Rinne 512: AC > BC b/l  Anterior rhinoscopy: Septum ***; bilateral inferior turbinates with *** No lesions of oral cavity/oropharynx; dentition *** No obviously palpable neck masses/lymphadenopathy/thyromegaly No respiratory distress or stridor  Seprately Identifiable Procedures:  None***  Impression & Plans:  Michelle Nolan is a 68 y.o. female with the following     Right sided hearing loss-  The patient presents today with complaints of right sided hearing loss and fullness.  It  does appear that she had a right sided ear infection, although they did place her on both oral and otic drops my suspicion is that this was otitis media.  On exam today I see no objective findings of infection, her tympanometry shows bilateral type A tymps but does so some asymmetry in hearing on the right in the higher frequencies.  Given the audiology findings and the patient's subjective complaints of right sided hearing loss I do think it is reasonable to obtain an MRI IAC for further evaluation.  The patient is in agreement with today's plan we will obtain MRI I would like her to follow-up in our office after MRI results are available for review.  She will follow-up sooner as needed.  She verbalized understanding and agreement to today's plan had no further questions or concerns.  Seen in DR given antibiotic steriod. Also Z pac, started abruptly   Cranial nerve palsy, cerebellar ataxia   Was in DR.  Day three felt full. No fluid but red, ear drops didn't work.. [place] urgent [place] Looked red no fluid. Zpk.  Also started doxycycline for ticknborne- this was all after.   Feels, off    - f/u ***   Thank you for allowing me the opportunity to care for your patient. Please do not hesitate to contact me should you have any other questions.  Sincerely, Burna Forts PA-C [facility] Phone: [phone] Fax: [phone]  12/20/2023, 11:09 AM

## 2023-12-23 NOTE — Patient Instructions (Signed)
 I have ordered an imaging study for you to complete prior to your next visit. Please call [facility] Scheduling at [phone] to schedule your imaging if you have not received a call within 24 hours. If you are unable to complete your imaging study prior to your next scheduled visit please call our office to let us know.

## 2023-12-29 ENCOUNTER — Ambulatory Visit (HOSPITAL_BASED_OUTPATIENT_CLINIC_OR_DEPARTMENT_OTHER)
Admission: RE | Admit: 2023-12-29 | Discharge: 2023-12-29 | Disposition: A | Discharge status: Home / Self Care | Source: Ambulatory Visit | Attending: Physician Assistant | Admitting: Physician Assistant

## 2023-12-29 DIAGNOSIS — H918X3 Other specified hearing loss, bilateral: Secondary | ICD-10-CM | POA: Insufficient documentation

## 2023-12-29 DIAGNOSIS — H9190 Unspecified hearing loss, unspecified ear: Secondary | ICD-10-CM | POA: Diagnosis not present

## 2023-12-29 MED ORDER — GADOBUTROL 1 MMOL/ML IV SOLN
5.0000 mL | Freq: Once | INTRAVENOUS | Status: AC | PRN
  Administered 2023-12-29: 5 mL via INTRAVENOUS

## 2024-01-02 ENCOUNTER — Ambulatory Visit (HOSPITAL_BASED_OUTPATIENT_CLINIC_OR_DEPARTMENT_OTHER)

## 2024-01-13 ENCOUNTER — Encounter: Payer: Self-pay | Admitting: *Deleted

## 2024-01-16 DIAGNOSIS — Z1159 Encounter for screening for other viral diseases: Secondary | ICD-10-CM | POA: Diagnosis not present

## 2024-01-16 DIAGNOSIS — Z0000 Encounter for general adult medical examination without abnormal findings: Secondary | ICD-10-CM | POA: Diagnosis not present

## 2024-01-17 DIAGNOSIS — N760 Acute vaginitis: Secondary | ICD-10-CM | POA: Diagnosis not present

## 2024-01-17 DIAGNOSIS — R300 Dysuria: Secondary | ICD-10-CM | POA: Diagnosis not present

## 2024-01-24 DIAGNOSIS — A6920 Lyme disease, unspecified: Secondary | ICD-10-CM | POA: Diagnosis not present

## 2024-01-24 DIAGNOSIS — A7749 Other ehrlichiosis: Secondary | ICD-10-CM | POA: Diagnosis not present

## 2024-01-24 DIAGNOSIS — G3184 Mild cognitive impairment, so stated: Secondary | ICD-10-CM | POA: Diagnosis not present

## 2024-02-04 DIAGNOSIS — U071 COVID-19: Secondary | ICD-10-CM | POA: Diagnosis not present

## 2024-02-04 DIAGNOSIS — J029 Acute pharyngitis, unspecified: Secondary | ICD-10-CM | POA: Diagnosis not present

## 2024-02-10 DIAGNOSIS — N760 Acute vaginitis: Secondary | ICD-10-CM | POA: Diagnosis not present

## 2024-02-10 DIAGNOSIS — G9331 Postviral fatigue syndrome: Secondary | ICD-10-CM | POA: Diagnosis not present

## 2024-02-10 DIAGNOSIS — Z114 Encounter for screening for human immunodeficiency virus [HIV]: Secondary | ICD-10-CM | POA: Diagnosis not present

## 2024-02-10 DIAGNOSIS — Z1159 Encounter for screening for other viral diseases: Secondary | ICD-10-CM | POA: Diagnosis not present

## 2024-02-10 DIAGNOSIS — Z113 Encounter for screening for infections with a predominantly sexual mode of transmission: Secondary | ICD-10-CM | POA: Diagnosis not present

## 2024-02-18 DIAGNOSIS — F430 Acute stress reaction: Secondary | ICD-10-CM | POA: Diagnosis not present

## 2024-02-18 DIAGNOSIS — E161 Other hypoglycemia: Secondary | ICD-10-CM | POA: Diagnosis not present

## 2024-02-18 DIAGNOSIS — N761 Subacute and chronic vaginitis: Secondary | ICD-10-CM | POA: Diagnosis not present

## 2024-02-18 DIAGNOSIS — U099 Post covid-19 condition, unspecified: Secondary | ICD-10-CM | POA: Diagnosis not present

## 2024-03-18 DIAGNOSIS — A7749 Other ehrlichiosis: Secondary | ICD-10-CM | POA: Diagnosis not present

## 2024-03-18 DIAGNOSIS — U099 Post covid-19 condition, unspecified: Secondary | ICD-10-CM | POA: Diagnosis not present

## 2024-03-18 DIAGNOSIS — G3184 Mild cognitive impairment, so stated: Secondary | ICD-10-CM | POA: Diagnosis not present

## 2024-03-18 DIAGNOSIS — R7309 Other abnormal glucose: Secondary | ICD-10-CM | POA: Diagnosis not present

## 2024-03-18 DIAGNOSIS — Z77120 Contact with and (suspected) exposure to mold (toxic): Secondary | ICD-10-CM | POA: Diagnosis not present

## 2024-03-18 DIAGNOSIS — A6920 Lyme disease, unspecified: Secondary | ICD-10-CM | POA: Diagnosis not present

## 2024-03-18 DIAGNOSIS — R791 Abnormal coagulation profile: Secondary | ICD-10-CM | POA: Diagnosis not present

## 2024-03-18 DIAGNOSIS — Z77098 Contact with and (suspected) exposure to other hazardous, chiefly nonmedicinal, chemicals: Secondary | ICD-10-CM | POA: Diagnosis not present

## 2024-03-18 DIAGNOSIS — Z79890 Hormone replacement therapy (postmenopausal): Secondary | ICD-10-CM | POA: Diagnosis not present

## 2024-03-18 DIAGNOSIS — R5382 Chronic fatigue, unspecified: Secondary | ICD-10-CM | POA: Diagnosis not present

## 2024-04-27 ENCOUNTER — Ambulatory Visit: Payer: Self-pay

## 2024-04-27 NOTE — Telephone Encounter (Signed)
 NNFN

## 2024-04-27 NOTE — Telephone Encounter (Signed)
 FYI Only or Action Required?: FYI only for provider.  Patient is followed in Pulmonology for New Pt.  Called Nurse Triage reporting Sleep Apnea.  Symptoms began several years ago.  Interventions attempted: Nothing.  Symptoms are: unchanged.  Triage Disposition: See PCP Within 2 Weeks  Patient/caregiver understands and will follow disposition?: Yes     Copied from CRM [id number]. Topic: Clinical - Red Word Triage >> Apr 27, 2024 12:41 PM Leila BROCKS wrote: Red Word that prompted transfer to Nurse Triage: Patient [phone] last saw Dr. Neda 02/18/2020 for OSA. Patient states shortness of breath, pain in chest and back, thought it was from lifting weights but has not gone away it's bee four weeks. Patient denies wheezing, dizziness, nor fever. Patient states not urgent and is very active. Patient wants to see Dr. Neda, has not contact pcp. Please advise. Reason for Disposition  [1] MILD longstanding difficulty breathing (e.g., minimal/no SOB at rest, SOB with walking, pulse < 100) AND [2] SAME as normal  Answer Assessment - Initial Assessment Questions E2C2 Pulmonary Triage - Initial Assessment Questions Chief Complaint (e.g., cough, sob, wheezing, fever, chills, sweat or additional symptoms) *Go to specific symptom protocol after initial questions. SOB with exertion and talking  How long have symptoms been present? X 1 year, worsening x 4 weeks with back/chest soreness  Have you tested for COVID or Flu? Note: If not, ask patient if a home test can be taken. If so, instruct patient to call back for positive results. No  MEDICINES:   Have you used any OTC meds to help with symptoms? No If yes, ask What medications? N/a  Have you used your inhalers/maintenance medication? No If yes, What medications? N/a I have one but I don't need it  If inhaler, ask How many puffs and how often? Note: Review instructions on medication in the chart. N/a  OXYGEN: Do you  wear supplemental oxygen? No If yes, How many liters are you supposed to use? N/a - does endorse hyperbaric chamber that she does PRN at home  Do you monitor your oxygen levels? Yes If yes, What is your reading (oxygen level) today? 99  What is your usual oxygen saturation reading?  (Note: Pulmonary O2 sats should be 90% or greater) 99     1. RESPIRATORY STATUS: Describe your breathing? (e.g., wheezing, shortness of breath, unable to speak, severe coughing)      See atove 2. ONSET: When did this breathing problem begin?      See above 3. PATTERN Does the difficult breathing come and go, or has it been constant since it started?      Comes and goes 4. SEVERITY: How bad is your breathing? (e.g., mild, moderate, severe)      Triager does not appreciate audible SOB/wheezing during call. Pt is speaking in full sentences.  5. RECURRENT SYMPTOM: Have you had difficulty breathing before? If Yes, ask: When was the last time? and What happened that time?      First time 6. CARDIAC HISTORY: Do you have any history of heart disease? (e.g., heart attack, angina, bypass surgery, angioplasty)      denies 7. LUNG HISTORY: Do you have any history of lung disease?  (e.g., pulmonary embolus, asthma, emphysema)     OSA does not wear CPAP at night, asthma 8. CAUSE: What do you think is causing the breathing problem?      unknown 9. OTHER SYMPTOMS: Do you have any other symptoms? (e.g., chest pain, cough, dizziness, fever, runny  nose)     denies 10. O2 SATURATION MONITOR:  Do you use an oxygen saturation monitor (pulse oximeter) at home? If Yes, ask: What is your reading (oxygen level) today? What is your usual oxygen saturation reading? (e.g., 95%)       N/a 11. PREGNANCY: Is there any chance you are pregnant? When was your last menstrual period?       N/a 12. TRAVEL: Have you traveled out of the country in the last month? (e.g., travel history, exposures)        Denies Endorses COVID hx x2    PAS scheduled to NT d/t being symptomatic. Triager noted that sx are chronic, with no acute sx at this time. Pt trying to re-establish with [facility] revious pt of Dr. MALVA, but has not been seen at [facility] since 2021. Pt requested earliest New Pt appt at any [facility] location. [facility] notified of New PT appt request, patient transferred for scheduling.  Protocols used: Breathing Difficulty-A-AH

## 2024-04-30 ENCOUNTER — Ambulatory Visit (INDEPENDENT_AMBULATORY_CARE_PROVIDER_SITE_OTHER): Admitting: Internal Medicine

## 2024-04-30 ENCOUNTER — Ambulatory Visit
Admission: RE | Admit: 2024-04-30 | Discharge: 2024-04-30 | Disposition: A | Discharge status: Home / Self Care | Source: Ambulatory Visit | Attending: Internal Medicine | Admitting: Internal Medicine

## 2024-04-30 ENCOUNTER — Encounter: Payer: Self-pay | Admitting: Internal Medicine

## 2024-04-30 VITALS — BP 90/60 | HR 63 | Temp 98.3°F | Ht 64.0 in | Wt 129.4 lb

## 2024-04-30 DIAGNOSIS — J982 Interstitial emphysema: Secondary | ICD-10-CM | POA: Diagnosis not present

## 2024-04-30 DIAGNOSIS — R918 Other nonspecific abnormal finding of lung field: Secondary | ICD-10-CM | POA: Diagnosis not present

## 2024-04-30 DIAGNOSIS — J4520 Mild intermittent asthma, uncomplicated: Secondary | ICD-10-CM | POA: Insufficient documentation

## 2024-04-30 DIAGNOSIS — R0602 Shortness of breath: Secondary | ICD-10-CM | POA: Diagnosis not present

## 2024-04-30 LAB — NITRIC OXIDE: Nitric Oxide: 20

## 2024-04-30 NOTE — Progress Notes (Signed)
 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 she biked 10 miles today every now and then she intermittently has to take a deep sigh and take in a deep breath not normal for her  Patient states she has an extensive family history of pulm fibrosis Patient states she was exposed to mold for 10 years in 2009 At this time patient does not have any inhalers at this time Patient diagnosed with COVID 3 months ago   No exacerbation at this time No evidence of heart failure at this time No evidence or signs of infection at this time No respiratory distress No fevers, chills, nausea, vomiting, diarrhea No evidence of lower extremity edema No evidence hemoptysis   Assessment of ASTHMA FeNO  20  ppb-Elevated exhaled Nitric oxide  testing is NOT consistent with type II inflammation   PAST MEDICAL HISTORY   Past Medical History:  Diagnosis Date   Asthma    Chondromalacia of right patella    Hypothyroidism      SURGICAL HISTORY   Past Surgical History:  Procedure Laterality Date   AUGMENTATION MAMMAPLASTY Bilateral    removed 11/2020   BREAST ENHANCEMENT SURGERY Bilateral    CESAREAN SECTION     x2   KNEE ARTHROSCOPY Left    KNEE ARTHROSCOPY WITH LATERAL MENISECTOMY Right 03/17/2015   Procedure: KNEE ARTHROSCOPY WITH PARTIAL LATERAL MENISECTOMY;  Surgeon: Toribio Chancy, MD;  Location: [facility];  Service: Orthopedics;  Laterality: Right;   KNEE ARTHROSCOPY WITH MEDIAL MENISECTOMY Right 03/17/2015   Procedure: RIGHT KNEE ARTHROSCOPY CHONDROPLASTY WITH MEDIAL MENISCECTOMY;  Surgeon: Toribio Chancy, MD;  Location: [facility];  Service: Orthopedics;  Laterality: Right;    WRIST SURGERY Left      FAMILY HISTORY   Family History  Problem Relation Age of Onset   COPD Father    Breast cancer Mother      SOCIAL HISTORY   Social History   Tobacco Use   Smoking status: Former    Current packs/day: 0.00    Average packs/day: 0.3 packs/day for 5.0 years (1.5 ttl pk-yrs)    Types: Cigarettes    Start date: 10/15/1973    Quit date: 10/15/1978    Years since quitting: 45.5   Smokeless tobacco: Never  Substance Use Topics   Alcohol use: Yes    Comment: social   Drug use: No     MEDICATIONS    Home Medication:  Current Outpatient Rx   Order #: [id number] Class: Historical Med   Order #: [id number] Class: Historical Med   Order #: [id number] Class: Historical Med   Order #: [id number] Class: Historical Med   Order #: [id number] Class: Historical Med   Order #: [id number] Class: Historical Med   Order #: [id number] Class: Historical Med   Order #: [id number] Class: Historical Med   Order #: [id number] Class: Historical Med   Order #: [id number] Class: Historical Med   Order #: [id number] Class: Historical Med    Current Medication:  Current Outpatient Medications:    albuterol (VENTOLIN HFA) 108 (90 Base) MCG/ACT inhaler, Inhale 2 puffs into the lungs every 4 (four) hours as needed for wheezing or shortness of breath (cough.)., Disp: , Rfl:    Bacillus Coagulans-Inulin (PROBIOTIC) 1-250 BILLION-MG  CAPS, Take 1 capsule by mouth daily., Disp: , Rfl:    Calcium Carbonate-Vit D-Min (CALCIUM 1200 PO), Take 1 tablet by mouth., Disp: , Rfl:    emtricitabine-tenofovir (TRUVADA) 200-300 MG tablet, Take 1 tablet by mouth daily., Disp: , Rfl:    estradiol  (ESTRACE ) 0.1 MG/GM vaginal cream, Place 1 Applicatorful vaginally daily., Disp: , Rfl:    loratadine (CLARITIN) 10 MG tablet, Take 10 mg by mouth daily as needed for allergies, rhinitis or itching., Disp: , Rfl:    progesterone  (PROMETRIUM ) 100 MG capsule, Take 100 mg by mouth daily., Disp: , Rfl:    progesterone   (PROMETRIUM ) 200 MG capsule, Take 200 mg by mouth daily., Disp: , Rfl:    testosterone  cypionate (DEPOTESTOTERONE CYPIONATE) 100 MG/ML injection, Inject 100 mg into the muscle every 28 (twenty-eight) days., Disp: , Rfl:    valACYclovir (VALTREX) 500 MG tablet, Take 500 mg by mouth daily., Disp: , Rfl:    VITAMIN D, CHOLECALCIFEROL, PO, Take 1 tablet by mouth daily., Disp: , Rfl:     ALLERGIES   Codeine and Sulfonamide derivatives  BP 90/60 (BP Location: Right Arm, Patient Position: Sitting, Cuff Size: Normal)   Pulse 63   Temp 98.3 F (36.8 C) (Oral)   Ht 5' 4 (1.626 m)   Wt 129 lb 6.4 oz (58.7 kg)   SpO2 99%   BMI 22.21 kg/m    Review of Systems: Gen:  Denies  fever, sweats, chills weight loss  HEENT: Denies blurred vision, double vision, ear pain, eye pain, hearing loss, nose bleeds, sore throat Cardiac:  No dizziness, chest pain or heaviness, chest tightness,edema, No JVD Resp:   No cough, -sputum production, -shortness of breath,-wheezing, -hemoptysis,  Other:  All other systems negative   Physical Examination:   General Appearance: No distress  EYES PERRLA, EOM intact.   NECK Supple, No JVD Pulmonary: normal breath sounds, No wheezing.  CardiovascularNormal S1,S2.  No m/r/g.   Abdomen: Benign, Soft, non-tender. 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 with a history of mold exposure and a previous history of diagnosis of asthma with a recent diagnosis of COVID infection  3 months ago  At this time patient may have intermittent reactive airways disease probable asthma which could have been triggered by her COVID-19 infection Open at this time I will not start any bronchodilator therapy at this time Will obtain pulmonary function testing to assess lung function  Patient has extensive family history of pulmonary fibrosis Recommend high-resolution CT scan for assessment    MEDICATION ADJUSTMENTS/LABS AND TESTS ORDERED: Recommend obtaining high-resolution CT scan Recommend obtaining pulmonary function testing  Avoid Allergens and Irritants Avoid secondhand smoke Avoid SICK contacts Recommend  Masking  when appropriate Recommend Keep up-to-date with vaccinations   CURRENT MEDICATIONS REVIEWED AT LENGTH WITH PATIENT TODAY   Patient  satisfied with Plan of action and management. All questions answered   Follow up 3 months   I spent a total of 65 minutes dedicated to the care of this patient on the date of this encounter to  include pre-visit review of records, face-to-face time with the patient discussing conditions above, post visit ordering of testing, clinical documentation with the electronic health record, making appropriate referrals as documented, and communicating necessary information to the patient's healthcare team.    The Patient requires high complexity decision making for assessment and support, frequent evaluation and titration of therapies, application of advanced monitoring technologies and extensive interpretation of multiple databases.  Patient satisfied with Plan of action and management. All questions answered    Nickolas Alm Cellar, M.D.  [facility]  Medical Director [facility] Medical Director [facility] Cardio-Pulmonary Department

## 2024-04-30 NOTE — Patient Instructions (Addendum)
 Recommend obtaining high-resolution CT scan Recommend obtaining pulmonary function testing Referral to Cardiology -assess Lower extremity  Avoid Allergens and Irritants Avoid secondhand smoke Avoid SICK contacts Recommend  Masking  when appropriate Recommend Keep up-to-date with vaccinations

## 2024-05-01 ENCOUNTER — Encounter: Payer: Self-pay | Admitting: Advanced Practice Midwife

## 2024-05-25 ENCOUNTER — Ambulatory Visit: Admitting: Internal Medicine

## 2024-05-25 DIAGNOSIS — J4520 Mild intermittent asthma, uncomplicated: Secondary | ICD-10-CM | POA: Diagnosis not present

## 2024-05-25 LAB — PULMONARY FUNCTION TEST
DL/VA % pred: 88 %
DL/VA: 3.66 ml/min/mmHg/L
DLCO unc % pred: 91 %
DLCO unc: 18.4 ml/min/mmHg
FEF 25-75 Post: 2.21 L/s
FEF 25-75 Pre: 1.87 L/s
FEF2575-%Change-Post: 17 %
FEF2575-%Pred-Post: 107 %
FEF2575-%Pred-Pre: 91 %
FEV1-%Change-Post: 4 %
FEV1-%Pred-Post: 104 %
FEV1-%Pred-Pre: 99 %
FEV1-Post: 2.52 L
FEV1-Pre: 2.42 L
FEV1FVC-%Change-Post: 2 %
FEV1FVC-%Pred-Pre: 98 %
FEV6-%Change-Post: 2 %
FEV6-%Pred-Post: 106 %
FEV6-%Pred-Pre: 104 %
FEV6-Post: 3.25 L
FEV6-Pre: 3.18 L
FEV6FVC-%Change-Post: 0 %
FEV6FVC-%Pred-Post: 103 %
FEV6FVC-%Pred-Pre: 102 %
FVC-%Change-Post: 1 %
FVC-%Pred-Post: 103 %
FVC-%Pred-Pre: 101 %
FVC-Post: 3.29 L
FVC-Pre: 3.23 L
Post FEV1/FVC ratio: 77 %
Post FEV6/FVC ratio: 99 %
Pre FEV1/FVC ratio: 75 %
Pre FEV6/FVC Ratio: 99 %
RV % pred: 109 %
RV: 2.4 L
TLC % pred: 108 %
TLC: 5.64 L

## 2024-05-25 NOTE — Patient Instructions (Signed)
 Full PFT completed today ? ?

## 2024-05-25 NOTE — Progress Notes (Signed)
 Full PFT completed today ? ?

## 2024-06-02 ENCOUNTER — Telehealth: Payer: Self-pay

## 2024-06-02 DIAGNOSIS — J4520 Mild intermittent asthma, uncomplicated: Secondary | ICD-10-CM

## 2024-06-02 DIAGNOSIS — M25473 Effusion, unspecified ankle: Secondary | ICD-10-CM

## 2024-06-02 DIAGNOSIS — J982 Interstitial emphysema: Secondary | ICD-10-CM

## 2024-06-02 NOTE — Telephone Encounter (Signed)
 Copied from CRM #[id number]. Topic: Referral - Question >> Jun 02, 2024  9:49 AM Michelle Nolan wrote: Reason for CRM: Patient is requesting Dr. Isaiah to refer her to a vascular doctor instead of  a heart doctor for her ankle swelling.

## 2024-06-02 NOTE — Telephone Encounter (Signed)
 Left detailed message on voicemail. NFN.

## 2024-06-04 ENCOUNTER — Other Ambulatory Visit: Payer: Self-pay

## 2024-06-04 DIAGNOSIS — M25473 Effusion, unspecified ankle: Secondary | ICD-10-CM

## 2024-06-04 NOTE — Telephone Encounter (Unsigned)
 Copied from CRM #[id number]. Topic: Referral - Question >> Jun 04, 2024 10:33 AM Isabell A wrote: Reason for CRM: Patient is requesting to resend vascular referral to the office in [facility] - requesting a call back once sent.   Callback number: [phone]

## 2024-06-16 ENCOUNTER — Ambulatory Visit: Payer: Self-pay

## 2024-06-18 DIAGNOSIS — L578 Other skin changes due to chronic exposure to nonionizing radiation: Secondary | ICD-10-CM | POA: Diagnosis not present

## 2024-06-18 DIAGNOSIS — L820 Inflamed seborrheic keratosis: Secondary | ICD-10-CM | POA: Diagnosis not present

## 2024-06-18 DIAGNOSIS — L719 Rosacea, unspecified: Secondary | ICD-10-CM | POA: Diagnosis not present

## 2024-06-18 DIAGNOSIS — D225 Melanocytic nevi of trunk: Secondary | ICD-10-CM | POA: Diagnosis not present

## 2024-06-18 DIAGNOSIS — L821 Other seborrheic keratosis: Secondary | ICD-10-CM | POA: Diagnosis not present

## 2024-06-18 DIAGNOSIS — D2271 Melanocytic nevi of right lower limb, including hip: Secondary | ICD-10-CM | POA: Diagnosis not present

## 2024-06-18 DIAGNOSIS — L814 Other melanin hyperpigmentation: Secondary | ICD-10-CM | POA: Diagnosis not present

## 2024-06-26 ENCOUNTER — Ambulatory Visit: Admitting: Internal Medicine

## 2024-07-01 DIAGNOSIS — Z1231 Encounter for screening mammogram for malignant neoplasm of breast: Secondary | ICD-10-CM | POA: Diagnosis not present

## 2024-07-01 DIAGNOSIS — Z124 Encounter for screening for malignant neoplasm of cervix: Secondary | ICD-10-CM | POA: Diagnosis not present

## 2024-07-01 DIAGNOSIS — Z01419 Encounter for gynecological examination (general) (routine) without abnormal findings: Secondary | ICD-10-CM | POA: Diagnosis not present

## 2024-07-02 ENCOUNTER — Other Ambulatory Visit (HOSPITAL_BASED_OUTPATIENT_CLINIC_OR_DEPARTMENT_OTHER): Payer: Self-pay | Admitting: Obstetrics and Gynecology

## 2024-07-02 DIAGNOSIS — E2839 Other primary ovarian failure: Secondary | ICD-10-CM

## 2024-07-06 ENCOUNTER — Encounter: Payer: Self-pay | Admitting: Internal Medicine

## 2024-07-07 ENCOUNTER — Telehealth: Admitting: Internal Medicine

## 2024-07-07 ENCOUNTER — Encounter: Payer: Self-pay | Admitting: Internal Medicine

## 2024-07-07 DIAGNOSIS — J4520 Mild intermittent asthma, uncomplicated: Secondary | ICD-10-CM | POA: Diagnosis not present

## 2024-07-07 NOTE — Patient Instructions (Signed)
 Exercise as tolerated Balloon exercises as discussed with patient

## 2024-07-07 NOTE — Progress Notes (Signed)
 [facility]  Pulmonary Medicine Consultation      Date: 07/07/2024,   MRN# [id number] Michelle Nolan 08/05/1956    I connected with the patient by telephone enabled telemedicine visit and verified that I am speaking with the correct person using two identifiers.    I discussed the limitations, risks, security and privacy concerns of performing an evaluation and management service by telemedicine and the availability of in-person appointments. I also discussed with the patient that there may be a patient responsible charge related to this service. The patient expressed understanding and agreed to proceed.  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  Patient states she has an extensive family history of pulm fibrosis Patient states she was exposed to mold for 10 years in 2009 At this time patient does not have any inhalers at this time Patient diagnosed with COVID 6 months ago  CT chest reviewed with patient from July 2025 No significant abnormalities or findings   No exacerbation at this time No evidence of heart failure at this time No evidence or signs of infection at this time No respiratory distress No fevers, chills, nausea, vomiting, diarrhea No evidence of lower extremity edema No evidence hemoptysis  Pulmonary function testing Aug 2025 FEV1 FVC ratio 77% predicted FEV1 104% predicted FVC 103% predicted No significant bronchodilator response FEF 2575 17% bronchodilator change-small airways TLC 108% predicted RV 109% predicted DLCO 91% predicted Flow volume loops-subtle obstructive pattern in the  expiratory limb Overall interpretation very minimal obstructive small airways disease Findings relayed to patient in detail  PAST MEDICAL HISTORY   Past Medical History:  Diagnosis Date   Asthma    Chondromalacia of right patella    Family history of pulmonary fibrosis    in father and half-sister from paternal side.   Hypothyroidism      SURGICAL HISTORY   Past Surgical History:  Procedure Laterality Date   AUGMENTATION MAMMAPLASTY Bilateral    removed 11/2020   BREAST ENHANCEMENT SURGERY Bilateral    CESAREAN SECTION     x2   KNEE ARTHROSCOPY Left    KNEE ARTHROSCOPY WITH LATERAL MENISECTOMY Right 03/17/2015   Procedure: KNEE ARTHROSCOPY WITH PARTIAL LATERAL MENISECTOMY;  Surgeon: Toribio Chancy, MD;  Location: [facility];  Service: Orthopedics;  Laterality: Right;   KNEE ARTHROSCOPY WITH MEDIAL MENISECTOMY Right 03/17/2015   Procedure: RIGHT KNEE ARTHROSCOPY CHONDROPLASTY WITH MEDIAL MENISCECTOMY;  Surgeon: Toribio Chancy, MD;  Location: [facility];  Service: Orthopedics;  Laterality: Right;   WRIST SURGERY Left      FAMILY HISTORY   Family History  Problem Relation Age of Onset   COPD Father    Breast cancer Mother      SOCIAL HISTORY   Social History   Tobacco Use   Smoking status: Former    Current packs/day: 0.00    Average packs/day: 0.3 packs/day for 5.0 years (1.5 ttl pk-yrs)    Types: Cigarettes    Start date: 10/15/1973    Quit date: 10/15/1978    Years since quitting: 45.7   Smokeless tobacco: Never   Tobacco comments:    Social smoker as a teen/young  adult in her 20's 04/30/24  Substance Use Topics   Alcohol use: Yes    Comment: social   Drug use: No     MEDICATIONS    Home Medication:  Current Outpatient Rx   Order #: [id number] Class: Historical Med   Order #: [id number] Class: Historical Med   Order #: [id number] Class: Historical Med   Order #: [id number] Class: Historical Med   Order #: [id number] Class:  Historical Med   Order #: [id number] Class: Historical Med   Order #: [id number] Class: Historical Med   Order #: [id number] Class: Historical Med   Order #: [id number] Class: Historical Med   Order #: [id number] Class: Historical Med   Order #: [id number] Class: Historical Med    Current Medication:  Current Outpatient Medications:    albuterol (VENTOLIN HFA) 108 (90 Base) MCG/ACT inhaler, Inhale 2 puffs into the lungs every 4 (four) hours as needed for wheezing or shortness of breath (cough.)., Disp: , Rfl:    Bacillus Coagulans-Inulin (PROBIOTIC) 1-250 BILLION-MG CAPS, Take 1 capsule by mouth daily., Disp: , Rfl:    Calcium Carbonate-Vit D-Min (CALCIUM 1200 PO), Take 1 tablet by mouth., Disp: , Rfl:    emtricitabine-tenofovir (TRUVADA) 200-300 MG tablet, Take 1 tablet by mouth daily., Disp: , Rfl:    estradiol  (ESTRACE ) 0.1 MG/GM vaginal cream, Place 1 Applicatorful vaginally daily., Disp: , Rfl:    loratadine (CLARITIN) 10 MG tablet, Take 10 mg by mouth daily as needed for allergies, rhinitis or itching., Disp: , Rfl:    progesterone  (PROMETRIUM ) 100 MG capsule, Take 100 mg by mouth daily., Disp: , Rfl:    progesterone  (PROMETRIUM ) 200 MG capsule, Take 200 mg by mouth daily., Disp: , Rfl:    testosterone  cypionate (DEPOTESTOTERONE CYPIONATE) 100 MG/ML injection, Inject 100 mg into the muscle every 28 (twenty-eight) days., Disp: , Rfl:    valACYclovir (VALTREX) 500 MG tablet, Take 500 mg by mouth daily., Disp: , Rfl:    VITAMIN D, CHOLECALCIFEROL, PO, Take 1 tablet by mouth daily., Disp: , Rfl:     ALLERGIES   Sulfonamide derivatives and Codeine  There were no vitals taken for this visit.   Review of Systems: Gen:  Denies  fever, sweats, chills weight loss  HEENT: Denies blurred vision, double vision, ear pain, eye pain, hearing loss, nose bleeds, sore throat Cardiac:  No dizziness, chest pain or heaviness, chest tightness,edema, No JVD Resp:   No cough, -sputum production, -shortness of  breath,-wheezing, -hemoptysis,  Other:  All other systems negative     IMAGING  CT coronary images CT abdominal images CT chest at 2005 All images reviewed in detail with the patient no significant interstitial lung disease no significant findings   ASSESSMENT/PLAN   68 year old pleasant white female follow-up visit for intermittent shortness of breath with intermittent reactive airways disease without any significant respiratory compromise 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 with history of mold exposure and previous history of asthma with a recent diagnosis of COVID infection 6 months ago  After review of pulmonary function testing patient has very mild obstructive airways disease in the setting of no significant respiratory symptoms Patient does not require maintenance therapy or albuterol inhaler at this  I advised patient to exercise as tolerated Plan for balloon exercises to work on lung capacity Consider repeat testing in 1 year  Avoid Allergens and Irritants Avoid secondhand smoke Avoid SICK contacts Recommend  Masking  when appropriate Recommend Keep up-to-date with vaccinations    MEDICATION ADJUSTMENTS/LABS AND TESTS ORDERED: Avoid Allergens and Irritants Avoid  secondhand smoke Avoid SICK contacts Recommend  Masking  when appropriate Recommend Keep up-to-date with vaccinations    CURRENT MEDICATIONS REVIEWED AT LENGTH WITH PATIENT TODAY   Patient  satisfied with Plan of action and management. All questions answered   Follow up 1 year   I spent a total of 47 minutes dedicated to the care of this patient on the date of this encounter to include pre-visit review of records, face-to-face time with the patient discussing conditions above, post visit ordering of testing, clinical documentation with the electronic health record, making appropriate referrals as documented, and communicating necessary information to the patient's  healthcare team.    The Patient requires high complexity decision making for assessment and support, frequent evaluation and titration of therapies, application of advanced monitoring technologies and extensive interpretation of multiple databases.  Patient satisfied with Plan of action and management. All questions answered    Nickolas Alm Cellar, M.D.  [facility]  Medical Director [facility] Medical Director [facility] Cardio-Pulmonary Department

## 2024-07-15 ENCOUNTER — Encounter (INDEPENDENT_AMBULATORY_CARE_PROVIDER_SITE_OTHER): Payer: Self-pay | Admitting: Nurse Practitioner

## 2024-07-15 ENCOUNTER — Encounter (INDEPENDENT_AMBULATORY_CARE_PROVIDER_SITE_OTHER): Payer: Self-pay

## 2024-07-22 ENCOUNTER — Encounter (INDEPENDENT_AMBULATORY_CARE_PROVIDER_SITE_OTHER): Payer: Self-pay

## 2024-07-22 ENCOUNTER — Encounter (INDEPENDENT_AMBULATORY_CARE_PROVIDER_SITE_OTHER): Payer: Self-pay | Admitting: Vascular Surgery

## 2024-07-28 DIAGNOSIS — N852 Hypertrophy of uterus: Secondary | ICD-10-CM | POA: Diagnosis not present

## 2024-08-04 DIAGNOSIS — S86301A Unspecified injury of muscle(s) and tendon(s) of peroneal muscle group at lower leg level, right leg, initial encounter: Secondary | ICD-10-CM | POA: Diagnosis not present

## 2024-08-04 DIAGNOSIS — M25571 Pain in right ankle and joints of right foot: Secondary | ICD-10-CM | POA: Diagnosis not present

## 2024-08-06 ENCOUNTER — Other Ambulatory Visit (INDEPENDENT_AMBULATORY_CARE_PROVIDER_SITE_OTHER): Payer: Self-pay | Admitting: Nurse Practitioner

## 2024-08-06 DIAGNOSIS — M25473 Effusion, unspecified ankle: Secondary | ICD-10-CM

## 2024-08-12 ENCOUNTER — Encounter (INDEPENDENT_AMBULATORY_CARE_PROVIDER_SITE_OTHER): Payer: Self-pay | Admitting: Nurse Practitioner

## 2024-08-12 ENCOUNTER — Ambulatory Visit (INDEPENDENT_AMBULATORY_CARE_PROVIDER_SITE_OTHER): Payer: Self-pay | Admitting: Nurse Practitioner

## 2024-08-12 ENCOUNTER — Ambulatory Visit (INDEPENDENT_AMBULATORY_CARE_PROVIDER_SITE_OTHER): Payer: Self-pay

## 2024-08-12 VITALS — BP 127/74 | HR 63 | Resp 18 | Ht 64.0 in | Wt 131.0 lb

## 2024-08-12 DIAGNOSIS — M25473 Effusion, unspecified ankle: Secondary | ICD-10-CM

## 2024-08-12 NOTE — Progress Notes (Signed)
 Subjective:    Patient ID: Michelle Nolan, female    DOB: 08-22-1956, 68 y.o.   MRN: [id number] Chief Complaint  Patient presents with   New Patient (Initial Visit)    Ref Kasa consult reflux and ankle swelling    The patient presents today for evaluation of bilateral lower extremity edema.  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  She notes that she recently had a full body MRI done and there was some concern of a cystlike structure behind her right knee.  She has compression socks but is not wearing them on a consistent basis.  Additionally there is no open wounds or ulcerations and no experience of claudication.  In addition to live a very active lifestyle the patient will also eats very healthy as well.  The patient underwent noninvasive studies which shows no evidence of DVT or superficial phlebitis bilaterally.  There is no evidence of deep venous insufficiency or superficial venous reflux noted bilaterally.  She does have a Baker's cyst noted behind the left knee but she notes she has had interventions done to the left knee.  The cystlike structure is noted on ultrasound but there is no flow seen within.  The MRI suggests it may have been a pseudoaneurysm or other aneurysmal component but again there is no flow seen within so at this point we do not believe it is vascular in nature.     Review of Systems  Cardiovascular:  Positive for leg swelling.  Hematological:  Bruises/bleeds easily.  All other systems reviewed and are negative.      Objective:   Physical Exam Vitals reviewed.  HENT:     Head: Normocephalic.  Cardiovascular:     Rate and Rhythm: Normal rate.     Pulses: Normal pulses.  Pulmonary:     Effort: Pulmonary effort is normal.  Musculoskeletal:        General: Tenderness present.  Skin:    General: Skin is warm and dry.  Neurological:     Mental  Status: She is alert and oriented to person, place, and time.  Psychiatric:        Mood and Affect: Mood normal.        Behavior: Behavior normal.        Thought Content: Thought content normal.        Judgment: Judgment normal.     BP 127/74   Pulse 63   Resp 18   Ht 5' 4 (1.626 m)   Wt 131 lb (59.4 kg)   BMI 22.49 kg/m   Past Medical History:  Diagnosis Date   Asthma    Chondromalacia of right patella    Family history of pulmonary fibrosis    in father and half-sister from paternal side.   Hypothyroidism     Social History   Socioeconomic History   Marital status: Married    Spouse name: Not on file   Number of children: Not on file   Years of education: Not on file   Highest education level: Not on file  Occupational History   Not on file  Tobacco Use   Smoking status: Former    Current packs/day: 0.00    Average packs/day: 0.3 packs/day for 5.0 years (1.5 ttl pk-yrs)    Types: Cigarettes    Start date: 10/15/1973    Quit date: 10/15/1978  Years since quitting: 45.8   Smokeless tobacco: Never   Tobacco comments:    Social smoker as a teen/young adult in her 20's 04/30/24  Substance and Sexual Activity   Alcohol use: Yes    Comment: social   Drug use: No   Sexual activity: Yes  Other Topics Concern   Not on file  Social History Narrative   Not on file   Social Drivers of Health   Financial Resource Strain: Not on file  Food Insecurity: Low Risk  (02/10/2024)   Received from [facility]   Hunger Vital Sign    Within the past 12 months, you worried that your food would run out before you got money to buy more: Never true    Within the past 12 months, the food you bought just didn't last and you didn't have money to get more. : Never true  Transportation Needs: No Transportation Needs (02/10/2024)   Received from [facility]    In the past 12 months, has lack of reliable transportation kept you from medical appointments, meetings,  work or from getting things needed for daily living? : No  Physical Activity: Not on file  Stress: Not on file  Social Connections: Unknown (02/25/2022)   Received from [facility]   Social Network    Social Network: Not on file  Intimate Partner Violence: Unknown (01/15/2022)   Received from [facility]   HITS    Physically Hurt: Not on file    Insult or Talk Down To: Not on file    Threaten Physical Harm: Not on file    Scream or Curse: Not on file    Past Surgical History:  Procedure Laterality Date   AUGMENTATION MAMMAPLASTY Bilateral    removed 11/2020   BREAST ENHANCEMENT SURGERY Bilateral    CESAREAN SECTION     x2   KNEE ARTHROSCOPY Left    KNEE ARTHROSCOPY WITH LATERAL MENISECTOMY Right 03/17/2015   Procedure: KNEE ARTHROSCOPY WITH PARTIAL LATERAL MENISECTOMY;  Surgeon: Toribio Chancy, MD;  Location: [facility];  Service: Orthopedics;  Laterality: Right;   KNEE ARTHROSCOPY WITH MEDIAL MENISECTOMY Right 03/17/2015   Procedure: RIGHT KNEE ARTHROSCOPY CHONDROPLASTY WITH MEDIAL MENISCECTOMY;  Surgeon: Toribio Chancy, MD;  Location:  SUR[facility]ERY [facility];  Service: Orthopedics;  Laterality: Right;   WRIST SURGERY Left     Family History  Problem Relation Age of Onset   COPD Father    Breast cancer Mother     Allergies  Allergen Reactions   Sulfonamide Derivatives Dermatitis and Other (See Comments)    REACTION: rash   Codeine Nausea Only       Latest Ref Rng & Units 01/19/2021    2:03 PM 03/17/2015    6:50 AM 01/04/2011    8:08 AM  CBC  WBC 4.0 - 10.5 K/uL 6.5     Hemoglobin 12.0 - 15.0 g/dL 87.7  86.0  86.0   Hematocrit 36.0 - 46.0 % 37.1     Platelets 150 - 400 K/uL 234         CMP     Component Value Date/Time   NA 136 01/19/2021 1403   K 4.4 01/19/2021 1403   CL 103 01/19/2021 1403   CO2 26 01/19/2021 1403   GLUCOSE 89 01/19/2021 1403   BUN 18 01/19/2021 1403   CREATININE 0.73 01/19/2021 1403   CALCIUM 9.4 01/19/2021 1403   PROT  6.9 01/19/2021 1403   ALBUMIN 4.3 01/19/2021 1403  AST 17 01/19/2021 1403   ALT 11 01/19/2021 1403   ALKPHOS 62 01/19/2021 1403   BILITOT 0.3 01/19/2021 1403   GFRNONAA >60 01/19/2021 1403     No results found.     Assessment & Plan:   1. Ankle swelling, unspecified laterality (Primary) Today the patient's noninvasive studies show no evidence of any vascular abnormality.  No venous reflux or venous insufficiency.  What was noted was a Baker's cyst in the left which is not unsurprising given her knee surgeries.  In the right the area seen on MRI is not a pseudoaneurysm or aneurysm a degeneration.  There is no vascular flow noted.  I suspect it may be a lipoma or possible scar tissue from her liposuction years ago.  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  This would account for the appearance as well as the tenderness in the bruising that she occasionally has.  She currently is very active and this should continue.  She eats a relatively healthy diet that she is advised to continue with this and to focus her efforts on more foods that can be anti-inflammatory in nature.  Otherwise other helpful tactics may be including use of compression socks when she is active daily.  We discussed that sleeping in the socks may not provide as much benefit.  She should also elevate legs when not active.  Following discussion we will plan to have the patient follow-up with us  on an as-needed basis.   Current Outpatient Medications on File Prior to Visit  Medication Sig Dispense Refill   Calcium Carbonate-Vit D-Min (CALCIUM 1200 PO) Take 1 tablet by mouth.     progesterone  (PROMETRIUM ) 200 MG capsule Take 200 mg by mouth daily.     VITAMIN D, CHOLECALCIFEROL, PO Take 1 tablet by mouth daily.     Bacillus Coagulans-Inulin (PROBIOTIC) 1-250 BILLION-MG CAPS Take 1 capsule by mouth daily.      emtricitabine-tenofovir (TRUVADA) 200-300 MG tablet Take 1 tablet by mouth daily.     estradiol  (ESTRACE ) 0.1 MG/GM vaginal cream Place 1 Applicatorful vaginally daily.     loratadine (CLARITIN) 10 MG tablet Take 10 mg by mouth daily as needed for allergies, rhinitis or itching.     progesterone  (PROMETRIUM ) 100 MG capsule Take 100 mg by mouth daily.     testosterone  cypionate (DEPOTESTOTERONE CYPIONATE) 100 MG/ML injection Inject 100 mg into the muscle every 28 (twenty-eight) days.     valACYclovir (VALTREX) 500 MG tablet Take 500 mg by mouth daily.     No current facility-administered medications on file prior to visit.    There are no Patient Instructions on file for this visit. No follow-ups on file.   August Gosser E Anyelo Mccue, NP

## 2024-08-17 DIAGNOSIS — N856 Intrauterine synechiae: Secondary | ICD-10-CM | POA: Diagnosis not present

## 2024-08-20 DIAGNOSIS — H00012 Hordeolum externum right lower eyelid: Secondary | ICD-10-CM | POA: Diagnosis not present

## 2024-08-26 DIAGNOSIS — H00022 Hordeolum internum right lower eyelid: Secondary | ICD-10-CM | POA: Diagnosis not present

## 2024-08-27 DIAGNOSIS — Z23 Encounter for immunization: Secondary | ICD-10-CM | POA: Diagnosis not present

## 2024-09-20 DIAGNOSIS — J029 Acute pharyngitis, unspecified: Secondary | ICD-10-CM | POA: Diagnosis not present

## 2024-09-20 DIAGNOSIS — R059 Cough, unspecified: Secondary | ICD-10-CM | POA: Diagnosis not present

## 2024-09-20 DIAGNOSIS — J069 Acute upper respiratory infection, unspecified: Secondary | ICD-10-CM | POA: Diagnosis not present

## 2024-10-20 ENCOUNTER — Encounter: Payer: Self-pay | Admitting: Internal Medicine

## 2024-10-23 ENCOUNTER — Other Ambulatory Visit (HOSPITAL_BASED_OUTPATIENT_CLINIC_OR_DEPARTMENT_OTHER): Payer: Self-pay

## 2024-10-23 DIAGNOSIS — I672 Cerebral atherosclerosis: Secondary | ICD-10-CM

## 2024-10-27 ENCOUNTER — Telehealth: Payer: Self-pay | Admitting: Cardiology

## 2024-10-27 NOTE — Telephone Encounter (Signed)
 OK with me [name]

## 2024-10-27 NOTE — Telephone Encounter (Signed)
 Patient is requesting to switch from Dr. Kate to Dr. Gollan. Please advise.

## 2024-10-29 NOTE — Telephone Encounter (Signed)
Pt calling in to follow up. Please advise

## 2024-12-01 ENCOUNTER — Ambulatory Visit (HOSPITAL_BASED_OUTPATIENT_CLINIC_OR_DEPARTMENT_OTHER)

## 2024-12-01 ENCOUNTER — Other Ambulatory Visit (HOSPITAL_BASED_OUTPATIENT_CLINIC_OR_DEPARTMENT_OTHER)

## 2024-12-22 ENCOUNTER — Ambulatory Visit: Admitting: Cardiovascular Disease
